# Patient Record
Sex: Female | Born: 1995 | Race: Black or African American | Hispanic: No | Marital: Single | Smoking: Never smoker
Health system: Southern US, Community
[De-identification: ages and names within clinical notes are randomized; demographics above are authoritative.]

## PROBLEM LIST (undated history)

## (undated) ENCOUNTER — Inpatient Hospital Stay (HOSPITAL_COMMUNITY): Payer: Self-pay

## (undated) DIAGNOSIS — IMO0001 Reserved for inherently not codable concepts without codable children: Secondary | ICD-10-CM

## (undated) DIAGNOSIS — O039 Complete or unspecified spontaneous abortion without complication: Secondary | ICD-10-CM

## (undated) DIAGNOSIS — B009 Herpesviral infection, unspecified: Secondary | ICD-10-CM

## (undated) DIAGNOSIS — Z789 Other specified health status: Secondary | ICD-10-CM

## (undated) HISTORY — PX: NO PAST SURGERIES: SHX2092

---
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
## 2014-02-22 ENCOUNTER — Encounter (HOSPITAL_BASED_OUTPATIENT_CLINIC_OR_DEPARTMENT_OTHER): Payer: Self-pay | Admitting: Emergency Medicine

## 2014-02-22 ENCOUNTER — Emergency Department (HOSPITAL_BASED_OUTPATIENT_CLINIC_OR_DEPARTMENT_OTHER)
Admission: EM | Admit: 2014-02-22 | Discharge: 2014-02-22 | Disposition: A | Discharge status: Home / Self Care | Payer: Medicaid Other | Attending: Emergency Medicine | Admitting: Emergency Medicine

## 2014-02-22 DIAGNOSIS — L509 Urticaria, unspecified: Secondary | ICD-10-CM | POA: Insufficient documentation

## 2014-02-22 DIAGNOSIS — R109 Unspecified abdominal pain: Secondary | ICD-10-CM | POA: Diagnosis not present

## 2014-02-22 DIAGNOSIS — Y9289 Other specified places as the place of occurrence of the external cause: Secondary | ICD-10-CM | POA: Insufficient documentation

## 2014-02-22 DIAGNOSIS — X58XXXA Exposure to other specified factors, initial encounter: Secondary | ICD-10-CM | POA: Diagnosis not present

## 2014-02-22 DIAGNOSIS — Z79899 Other long term (current) drug therapy: Secondary | ICD-10-CM | POA: Diagnosis not present

## 2014-02-22 DIAGNOSIS — Y9389 Activity, other specified: Secondary | ICD-10-CM | POA: Diagnosis not present

## 2014-02-22 DIAGNOSIS — T781XXA Other adverse food reactions, not elsewhere classified, initial encounter: Secondary | ICD-10-CM | POA: Diagnosis present

## 2014-02-22 DIAGNOSIS — Z792 Long term (current) use of antibiotics: Secondary | ICD-10-CM | POA: Diagnosis not present

## 2014-02-22 DIAGNOSIS — Y998 Other external cause status: Secondary | ICD-10-CM | POA: Insufficient documentation

## 2014-02-22 MED ORDER — PREDNISONE 20 MG PO TABS
40.0000 mg | ORAL_TABLET | Freq: Once | ORAL | Status: AC
  Administered 2014-02-22: 40 mg via ORAL
  Filled 2014-02-22: qty 2

## 2014-02-22 MED ORDER — EPINEPHRINE 0.3 MG/0.3ML IJ SOAJ: 0.3000 mg | Freq: Once | INTRAMUSCULAR | Status: DC

## 2014-02-22 MED ORDER — DIPHENHYDRAMINE HCL 25 MG PO CAPS
25.0000 mg | ORAL_CAPSULE | Freq: Once | ORAL | Status: AC
  Administered 2014-02-22: 25 mg via ORAL
  Filled 2014-02-22: qty 1

## 2014-02-22 MED ORDER — DIPHENHYDRAMINE HCL 25 MG PO TABS
25.0000 mg | ORAL_TABLET | Freq: Four times a day (QID) | ORAL | Status: DC
Start: 2014-02-22 — End: 2014-11-24

## 2014-02-22 MED ORDER — FAMOTIDINE 20 MG PO TABS: 20.0000 mg | ORAL_TABLET | Freq: Two times a day (BID) | ORAL | Status: DC

## 2014-02-22 MED ORDER — FAMOTIDINE 20 MG PO TABS
20.0000 mg | ORAL_TABLET | Freq: Once | ORAL | Status: AC
Start: 2014-02-22 — End: 2014-02-22
  Administered 2014-02-22: 20 mg via ORAL
  Filled 2014-02-22: qty 1

## 2014-02-22 MED ORDER — PREDNISONE 10 MG PO TABS
40.0000 mg | ORAL_TABLET | Freq: Every day | ORAL | Status: DC
Start: 2014-02-22 — End: 2014-11-24

## 2014-02-22 NOTE — ED Notes (Signed)
Pt reports she ate crab legs 1 hr ago and has begun to itch and having abd pain

## 2014-02-22 NOTE — ED Notes (Signed)
Dr Madilyn Hookees okay with discharging pt now prior to reassessing meds given.

## 2014-02-22 NOTE — ED Provider Notes (Signed)
CSN: [id number]     Arrival date & time 02/22/14  1954 History   First MD Initiated [id number] with Patient 02/22/14 2009     Chief Complaint  Patient presents with  . Allergic Reaction      Patient is a 18 y.o. female presenting with allergic reaction. The history is provided by the patient.  Allergic Reaction  Gabrielle Lopez presents for a possible allergic reaction. Her symptoms started about an hour ago. She was eating crab legs when she developed abdominal pain. The abdominal pain has since resolved. Shortly thereafter she developed itching and hives on her face. Eyes similar previous symptoms. She denies any tongue swelling or throat swelling. She has no itching in her mouth or throat. She has no shortness of breath or cough. No vomiting. Symptoms are mild, constant, and improving.  History reviewed. No pertinent past medical history. History reviewed. No pertinent past surgical history. History reviewed. No pertinent family history. History  Substance Use Topics  . Smoking status: Never Smoker   . Smokeless tobacco: Not on file  . Alcohol Use: No   OB History    No data available     Review of Systems  All other systems reviewed and are negative.     Allergies  Review of patient's allergies indicates no known allergies.  Home Medications   Prior to Admission medications   Medication Sig Start Date End Date Taking? Authorizing Provider  methocarbamol (ROBAXIN) 500 MG tablet Take 1 tablet (500 mg total) by mouth 2 (two) times daily. 06/01/12   Garlon HatchetLisa M Sanders, PA-C  metroNIDAZOLE (FLAGYL) 500 MG tablet Take 2 tablets (1,000 mg total) by mouth 2 (two) times daily. 06/01/12   Garlon HatchetLisa M Sanders, PA-C  metroNIDAZOLE (FLAGYL) 500 MG tablet Take 1 tablet (500 mg total) by mouth 2 (two) times daily. 02/22/13   Hope Orlene OchM Neese, NP  oxyCODONE-acetaminophen (PERCOCET/ROXICET) 5-325 MG per tablet Take 1 tablet by mouth every 4 (four) hours as needed for pain. 06/01/12   Garlon HatchetLisa M Sanders, PA-C    BP 125/76 mmHg  Pulse 77  Temp(Src) 98.2 F (36.8 C) (Oral)  Resp 18  Ht 5\' 6"  (1.676 m)  Wt 138 lb (62.596 kg)  BMI 22.28 kg/m2  SpO2 100%  LMP 02/08/2014 Physical Exam  Constitutional: She is oriented to person, place, and time. She appears well-developed and well-nourished.  HENT:  Head: Normocephalic and atraumatic.  Mouth/Throat: Oropharynx is clear and moist.  Eyes: Pupils are equal, round, and reactive to light.  Cardiovascular: Normal rate and regular rhythm.   No murmur heard. Pulmonary/Chest: Effort normal and breath sounds normal. No respiratory distress.  Abdominal: Soft. There is no tenderness. There is no rebound and no guarding.  Musculoskeletal: She exhibits no edema or tenderness.  Neurological: She is alert and oriented to person, place, and time.  Skin: Skin is warm and dry.  Scattered urticaria on face  Psychiatric: She has a normal mood and affect. Her behavior is normal.  Nursing note and vitals reviewed.   ED Course  Procedures (including critical care time) Labs Review Labs Reviewed - No data to display  Imaging Review No results found.   EKG Interpretation None      MDM   Final diagnoses:  Allergic reaction to food    Patient here for new onset food allergy. No evidence of anaphylaxis based on history and exam. Treating for allergy with steroids, Pepcid, Benadryl. Discussed with patient home care for allergic reaction as well as return  precautions. Providing prescriptions for prednisone, Pepcid, Benadryl. Also prescribed fighting prescription for EpiPen. Discussed with patient risk of recurrent worsening reaction and use of EpiPen if she does develop these symptoms.    Tilden FossaElizabeth Kaavya Puskarich, MD 02/22/14 2035

## 2014-02-22 NOTE — Discharge Instructions (Signed)
Food Allergy °A food allergy occurs from eating something you are sensitive to. Food allergies occur in all age groups. It may be passed to you from your parents (heredity).  °CAUSES  °Some common causes are cow's milk, seafood, eggs, nuts (including peanut butter), wheat, and soybeans. °SYMPTOMS  °Common problems are:  °· Swelling around the mouth. °· An itchy, red rash. °· Hives. °· Vomiting. °· Diarrhea. °Severe allergic reactions are life-threatening. This reaction is called anaphylaxis. It can cause the mouth and throat to swell. This makes it hard to breathe and swallow. In severe reactions, only a small amount of food may be fatal within seconds. °HOME CARE INSTRUCTIONS  °· If you are unsure what caused the reaction, keep a diary of foods eaten and symptoms that followed. Avoid foods that cause reactions. °· If hives or rash are present: °¨ Take medicines as directed. °¨ Use an over-the-counter antihistamine (diphenhydramine) to treat hives and itching as needed. °¨ Apply cold compresses to the skin or take baths in cool water. Avoid hot baths or showers. These will increase the redness and itching. °· If you are severely allergic: °¨ Hospitalization is often required following a severe reaction. °¨ Wear a medical alert bracelet or necklace that describes the allergy. °¨ Carry your anaphylaxis kit or epinephrine injection with you at all times. Both you and your family members should know how to use this. This can be lifesaving if you have a severe reaction. If epinephrine is used, it is important for you to seek immediate medical care or call your local emergency services (911 in [place] When the epinephrine wears off, it can be followed by a delayed reaction, which can be fatal. °· Replace your epinephrine immediately after use in case of another reaction. °· Ask your caregiver for instructions if you have not been taught how to use an epinephrine injection. °· Do not drive until medicines used to treat the  reaction have worn off, unless approved by your caregiver. °SEEK MEDICAL CARE IF:  °· You suspect a food allergy. Symptoms generally happen within 30 minutes of eating a food. °· Your symptoms have not gone away within 2 days. See your caregiver sooner if symptoms are getting worse. °· You develop new symptoms. °· You want to retest yourself with a food or drink you think causes an allergic reaction. Never do this if an anaphylactic reaction to that food or drink has happened before. °· There is a return of the symptoms which brought you to your caregiver. °SEEK IMMEDIATE MEDICAL CARE IF:  °· You have trouble breathing, are wheezing, or you have a tight feeling in your chest or throat. °· You have a swollen mouth, or you have hives, swelling, or itching all over your body. Use your epinephrine injection immediately. This is given into the outside of your thigh, deep into the muscle. Following use of the epinephrine injection, seek help right away. °Seek immediate medical care or call your local emergency services (911 in [place] °MAKE SURE YOU:  °· Understand these instructions. °· Will watch your condition. °· Will get help right away if you are not doing well or get worse. °Document Released: 02/25/2000 Document Revised: 05/22/2011 Document Reviewed: 10/17/2007 °[employer]® Patient Information ©2015 [employer]. This information is not intended to replace advice given to you by your health care provider. Make sure you discuss any questions you have with your health care provider. ° °

## 2014-04-24 ENCOUNTER — Emergency Department (HOSPITAL_BASED_OUTPATIENT_CLINIC_OR_DEPARTMENT_OTHER)
Admission: EM | Admit: 2014-04-24 | Discharge: 2014-04-25 | Disposition: A | Discharge status: Home / Self Care | Payer: Medicaid Other | Attending: Emergency Medicine | Admitting: Emergency Medicine

## 2014-04-24 DIAGNOSIS — Z79899 Other long term (current) drug therapy: Secondary | ICD-10-CM | POA: Diagnosis not present

## 2014-04-24 DIAGNOSIS — O2340 Unspecified infection of urinary tract in pregnancy, unspecified trimester: Secondary | ICD-10-CM | POA: Diagnosis present

## 2014-04-24 DIAGNOSIS — N390 Urinary tract infection, site not specified: Secondary | ICD-10-CM

## 2014-04-24 DIAGNOSIS — Z3A00 Weeks of gestation of pregnancy not specified: Secondary | ICD-10-CM | POA: Insufficient documentation

## 2014-04-24 DIAGNOSIS — Z7952 Long term (current) use of systemic steroids: Secondary | ICD-10-CM | POA: Diagnosis not present

## 2014-04-24 DIAGNOSIS — Z3490 Encounter for supervision of normal pregnancy, unspecified, unspecified trimester: Secondary | ICD-10-CM

## 2014-04-25 ENCOUNTER — Encounter (HOSPITAL_BASED_OUTPATIENT_CLINIC_OR_DEPARTMENT_OTHER): Payer: Self-pay | Admitting: *Deleted

## 2014-04-25 LAB — URINE MICROSCOPIC-ADD ON

## 2014-04-25 LAB — URINALYSIS, ROUTINE W REFLEX MICROSCOPIC
Bilirubin Urine: NEGATIVE
Glucose, UA: NEGATIVE mg/dL
Ketones, ur: NEGATIVE mg/dL
Nitrite: POSITIVE — AB
PROTEIN: 100 mg/dL — AB
Specific Gravity, Urine: 1.031 — ABNORMAL HIGH (ref 1.005–1.030)
Urobilinogen, UA: 1 mg/dL (ref 0.0–1.0)
pH: 6.5 (ref 5.0–8.0)

## 2014-04-25 LAB — PREGNANCY, URINE: Preg Test, Ur: POSITIVE — AB

## 2014-04-25 MED ORDER — NITROFURANTOIN MONOHYD MACRO 100 MG PO CAPS
100.0000 mg | ORAL_CAPSULE | Freq: Once | ORAL | Status: AC
  Administered 2014-04-25: 100 mg via ORAL
  Filled 2014-04-25: qty 1

## 2014-04-25 MED ORDER — PRENATAL COMPLETE 14-0.4 MG PO TABS: 1.0000 | ORAL_TABLET | Freq: Every morning | ORAL | Status: DC

## 2014-04-25 MED ORDER — NITROFURANTOIN MONOHYD MACRO 100 MG PO CAPS: 100.0000 mg | ORAL_CAPSULE | Freq: Two times a day (BID) | ORAL | Status: DC

## 2014-04-25 NOTE — ED Provider Notes (Signed)
CSN: [id number]     Arrival date & time 04/24/14  2347 History   First MD Initiated Contact with Patient 04/25/14 0003     Chief Complaint  Patient presents with  . Urinary Tract Infection     (Consider location/radiation/quality/duration/timing/severity/associated sxs/prior Treatment) Patient is a [age] y.o. female presenting with urinary tract infection. The history is provided by the patient.  Urinary Tract Infection This is a recurrent problem. The current episode started 12 to 24 hours ago. The problem occurs constantly. The problem has not changed since onset.Pertinent negatives include no chest pain, no abdominal pain, no headaches and no shortness of breath. Nothing aggravates the symptoms. Nothing relieves the symptoms. She has tried nothing for the symptoms. The treatment provided no relief.  Has had dysuria with each urination x 1 day.  No f/c/r.  No abdominal pain.  No nausea vomiting or diarrhea.  No vaginal bleeding or discharge.  States her periods are irregular.    History reviewed. No pertinent past medical history. History reviewed. No pertinent past surgical history. History reviewed. No pertinent family history. History  Substance Use Topics  . Smoking status: Never Smoker   . Smokeless tobacco: Not on file  . Alcohol Use: No   OB History    No data available     Review of Systems  Constitutional: Negative for fever.  Respiratory: Negative for shortness of breath.   Cardiovascular: Negative for chest pain.  Gastrointestinal: Negative for nausea, vomiting, abdominal pain and diarrhea.  Genitourinary: Positive for dysuria. Negative for frequency, hematuria, flank pain, vaginal bleeding, vaginal discharge, genital sores and pelvic pain.  Neurological: Negative for headaches.  All other systems reviewed and are negative.     Allergies  Shellfish allergy  Home Medications   Prior to Admission medications   Medication Sig Start Date End Date Taking?  Authorizing Provider  diphenhydrAMINE (BENADRYL) 25 MG tablet Take 1 tablet (25 mg total) by mouth every 6 (six) hours. 02/22/14   Tilden FossaElizabeth Rees, MD  EPINEPHrine 0.3 mg/0.3 mL IJ SOAJ injection Inject 0.3 mLs (0.3 mg total) into the muscle once. 02/22/14   Tilden FossaElizabeth Rees, MD  famotidine (PEPCID) 20 MG tablet Take 1 tablet (20 mg total) by mouth 2 (two) times daily. 02/22/14   Tilden FossaElizabeth Rees, MD  methocarbamol (ROBAXIN) 500 MG tablet Take 1 tablet (500 mg total) by mouth 2 (two) times daily. 06/01/12   Garlon HatchetLisa M Sanders, PA-C  metroNIDAZOLE (FLAGYL) 500 MG tablet Take 2 tablets (1,000 mg total) by mouth 2 (two) times daily. 06/01/12   Garlon HatchetLisa M Sanders, PA-C  metroNIDAZOLE (FLAGYL) 500 MG tablet Take 1 tablet (500 mg total) by mouth 2 (two) times daily. 02/22/13   Hope Orlene OchM Neese, NP  nitrofurantoin, macrocrystal-monohydrate, (MACROBID) 100 MG capsule Take 1 capsule (100 mg total) by mouth 2 (two) times daily. X 7 days 04/25/14   Rahkim Rabalais K Shanoah Asbill-Rasch, MD  oxyCODONE-acetaminophen (PERCOCET/ROXICET) 5-325 MG per tablet Take 1 tablet by mouth every 4 (four) hours as needed for pain. 06/01/12   Garlon HatchetLisa M Sanders, PA-C  predniSONE (DELTASONE) 10 MG tablet Take 4 tablets (40 mg total) by mouth daily. Start taking on 02/23/14 02/22/14   Tilden FossaElizabeth Rees, MD  Prenatal Vit-Fe Fumarate-FA (PRENATAL COMPLETE) 14-0.4 MG TABS Take 1 tablet by mouth every morning. 04/25/14   Dim Meisinger K Elvyn Krohn-Rasch, MD   BP 133/70 mmHg  Pulse 104  Temp(Src) 98.1 F (36.7 C) (Oral)  Resp 20  Ht 5\' 7"  (1.702 m)  Wt 136 lb (61.689 kg)  BMI 21.30 kg/m2  SpO2 100% Physical Exam  Constitutional: She is oriented to person, place, and time. She appears well-developed and well-nourished. No distress.  HENT:  Head: Normocephalic and atraumatic.  Mouth/Throat: Oropharynx is clear and moist.  Eyes: Conjunctivae are normal. Pupils are equal, round, and reactive to light.  Neck: Normal range of motion. Neck supple.  Cardiovascular: Normal rate, regular  rhythm and intact distal pulses.   Pulmonary/Chest: Effort normal and breath sounds normal. No respiratory distress. She has no wheezes. She has no rales.  Abdominal: Soft. Bowel sounds are normal. There is no tenderness. There is no rebound and no guarding.  Musculoskeletal: Normal range of motion.  Neurological: She is alert and oriented to person, place, and time.  Skin: Skin is warm and dry.  Psychiatric: She has a normal mood and affect.    ED Course  Procedures (including critical care time) Labs Review Labs Reviewed  URINALYSIS, ROUTINE W REFLEX MICROSCOPIC - Abnormal; Notable for the following:    APPearance CLOUDY (*)    Specific Gravity, Urine 1.031 (*)    Hgb urine dipstick LARGE (*)    Protein, ur 100 (*)    Nitrite POSITIVE (*)    Leukocytes, UA MODERATE (*)    All other components within normal limits  PREGNANCY, URINE - Abnormal; Notable for the following:    Preg Test, Ur POSITIVE (*)    All other components within normal limits  URINE MICROSCOPIC-ADD ON - Abnormal; Notable for the following:    Squamous Epithelial / LPF FEW (*)    Bacteria, UA MANY (*)    All other components within normal limits    Imaging Review No results found.   EKG Interpretation None      MDM   Final diagnoses:  UTI (lower urinary tract infection)  Pregnancy    UTI and pregnancy.  No pain discharge or bleeding.  Will prescribe macrobid as is safe in pregnancy.   Patient reports she has a GYN and will follow up with them.  Strict return precautions given.      Jasmine Awe, MD 04/25/14 [phone]

## 2014-04-25 NOTE — ED Notes (Signed)
Pt reports dysuria, urinary frequency, urgency x 1 day.  Denies back pain.

## 2014-04-25 NOTE — ED Notes (Signed)
Burning w urination x 1 day

## 2014-04-25 NOTE — ED Notes (Signed)
Patient gave urine sample. 

## 2014-11-01 ENCOUNTER — Emergency Department (HOSPITAL_BASED_OUTPATIENT_CLINIC_OR_DEPARTMENT_OTHER)
Admission: EM | Admit: 2014-11-01 | Discharge: 2014-11-01 | Disposition: A | Discharge status: Home / Self Care | Payer: Medicaid Other | Attending: Emergency Medicine | Admitting: Emergency Medicine

## 2014-11-01 ENCOUNTER — Encounter (HOSPITAL_BASED_OUTPATIENT_CLINIC_OR_DEPARTMENT_OTHER): Payer: Self-pay | Admitting: *Deleted

## 2014-11-01 DIAGNOSIS — L259 Unspecified contact dermatitis, unspecified cause: Secondary | ICD-10-CM

## 2014-11-01 DIAGNOSIS — Z7952 Long term (current) use of systemic steroids: Secondary | ICD-10-CM | POA: Diagnosis not present

## 2014-11-01 DIAGNOSIS — Z8619 Personal history of other infectious and parasitic diseases: Secondary | ICD-10-CM | POA: Diagnosis not present

## 2014-11-01 DIAGNOSIS — Z3202 Encounter for pregnancy test, result negative: Secondary | ICD-10-CM | POA: Insufficient documentation

## 2014-11-01 DIAGNOSIS — Z79899 Other long term (current) drug therapy: Secondary | ICD-10-CM | POA: Diagnosis not present

## 2014-11-01 DIAGNOSIS — L988 Other specified disorders of the skin and subcutaneous tissue: Secondary | ICD-10-CM | POA: Insufficient documentation

## 2014-11-01 HISTORY — DX: Herpesviral infection, unspecified: B00.9

## 2014-11-01 LAB — URINALYSIS, ROUTINE W REFLEX MICROSCOPIC
Bilirubin Urine: NEGATIVE
Glucose, UA: NEGATIVE mg/dL
Hgb urine dipstick: NEGATIVE
Ketones, ur: NEGATIVE mg/dL
Nitrite: NEGATIVE
Protein, ur: NEGATIVE mg/dL
Specific Gravity, Urine: 1.025 (ref 1.005–1.030)
Urobilinogen, UA: 0.2 mg/dL (ref 0.0–1.0)
pH: 6 (ref 5.0–8.0)

## 2014-11-01 LAB — URINE MICROSCOPIC-ADD ON

## 2014-11-01 LAB — PREGNANCY, URINE: Preg Test, Ur: NEGATIVE

## 2014-11-01 MED ORDER — ACYCLOVIR 400 MG PO TABS: 400.0000 mg | ORAL_TABLET | Freq: Every day | ORAL | Status: DC

## 2014-11-01 NOTE — ED Notes (Signed)
Hx of herpes, states she thinks she is having an outbreak, having soreness in vaginal area, noted having one lesion at area as well, no recent intercourse

## 2014-11-01 NOTE — Discharge Instructions (Signed)
Return to the ED with any concerns including increased pain, fever, abdominal pain, or any other alarming symptoms

## 2014-11-01 NOTE — ED Notes (Signed)
Here for evaluation of possible herpes outbreak

## 2014-11-01 NOTE — ED Provider Notes (Signed)
CSN: [id number]     Arrival date & time 11/01/14  1042 History   First MD Initiated Contact with Patient 11/01/14 1056     Chief Complaint  Patient presents with  . outbreak of herpes      (Consider location/radiation/quality/duration/timing/severity/associated sxs/prior Treatment) HPI  Pt presents with c/o rash in perineal region.  She shaved 2 days ago and since then has noted bumps on her skin.  Bumps are somewhat ithcy.  No significant pain.  No vaginal discharge, no abdominal pain.  Has hx of genital herpes and is wondering if this is an outbreak.  No fever/chills.  No other systemic symptoms.  Symptoms are continous.  There are no other associated systemic symptoms, there are no other alleviating or modifying factors.   Past Medical History  Diagnosis Date  . Herpes    History reviewed. No pertinent past surgical history. No family history on file. Social History  Substance Use Topics  . Smoking status: Never Smoker   . Smokeless tobacco: None  . Alcohol Use: Yes     Comment: sociably   OB History    No data available     Review of Systems  ROS reviewed and all otherwise negative except for mentioned in HPI    Allergies  Shellfish allergy  Home Medications   Prior to Admission medications   Medication Sig Start Date End Date Taking? Authorizing Provider  acyclovir (ZOVIRAX) 400 MG tablet Take 1 tablet (400 mg total) by mouth 5 (five) times daily. 11/01/14   Jerelyn Scott, MD  diphenhydrAMINE (BENADRYL) 25 MG tablet Take 1 tablet (25 mg total) by mouth every 6 (six) hours. 02/22/14   Tilden Fossa, MD  EPINEPHrine 0.3 mg/0.3 mL IJ SOAJ injection Inject 0.3 mLs (0.3 mg total) into the muscle once. 02/22/14   Tilden Fossa, MD  famotidine (PEPCID) 20 MG tablet Take 1 tablet (20 mg total) by mouth 2 (two) times daily. 02/22/14   Tilden Fossa, MD  methocarbamol (ROBAXIN) 500 MG tablet Take 1 tablet (500 mg total) by mouth 2 (two) times daily. 06/01/12   Garlon Hatchet, PA-C  metroNIDAZOLE (FLAGYL) 500 MG tablet Take 2 tablets (1,000 mg total) by mouth 2 (two) times daily. 06/01/12   Garlon Hatchet, PA-C  metroNIDAZOLE (FLAGYL) 500 MG tablet Take 1 tablet (500 mg total) by mouth 2 (two) times daily. 02/22/13   Hope Orlene Och, NP  nitrofurantoin, macrocrystal-monohydrate, (MACROBID) 100 MG capsule Take 1 capsule (100 mg total) by mouth 2 (two) times daily. X 7 days 04/25/14   April Palumbo, MD  oxyCODONE-acetaminophen (PERCOCET/ROXICET) 5-325 MG per tablet Take 1 tablet by mouth every 4 (four) hours as needed for pain. 06/01/12   Garlon Hatchet, PA-C  predniSONE (DELTASONE) 10 MG tablet Take 4 tablets (40 mg total) by mouth daily. Start taking on 02/23/14 02/22/14   Tilden Fossa, MD  Prenatal Vit-Fe Fumarate-FA (PRENATAL COMPLETE) 14-0.4 MG TABS Take 1 tablet by mouth every morning. 04/25/14   April Palumbo, MD   BP 121/59 mmHg  Pulse 88  Temp(Src) 98.7 F (37.1 C) (Oral)  Resp 16  Ht  (1.676 m)  Wt 135 lb (61.236 kg)  BMI 21.80 kg/m2  SpO2 100%  LMP 10/03/2014 (Exact Date)  Vitals reviewed Physical Exam  Physical Examination: General appearance - alert, well appearing, and in no distress Mental status - alert, oriented to person, place, and time Eyes - no conjunctival injection, no scleral icterus Pelvic - normal external genitalia, vulva-  appears to have follicular irritation, no vesicles c/w herpes, no induration or fluctuance Neurological - alert, oriented, normal speech,  Extremities - peripheral pulses normal, no pedal edema, no clubbing or cyanosis Skin - normal coloration and turgor, no rashes  ED Course  Procedures (including critical care time) Labs Review Labs Reviewed  URINALYSIS, ROUTINE W REFLEX MICROSCOPIC (NOT AT [facility]) - Abnormal; Notable for the following:    APPearance CLOUDY (*)    Leukocytes, UA SMALL (*)    All other components within normal limits  URINE MICROSCOPIC-ADD ON - Abnormal; Notable for the following:     Squamous Epithelial / LPF MANY (*)    Bacteria, UA MANY (*)    All other components within normal limits  URINE CULTURE  PREGNANCY, URINE    Imaging Review No results found. I have personally reviewed and evaluated these images and lab results as part of my medical decision-making.   EKG Interpretation None      MDM   Final diagnoses:  Skin irritation from shaving    Pt presenting with irritation of perineal skin due to shaving, no vesicles to suggest herpes.  Pt given rx for acyclovir in case she gets an outbreak of herpes in the next few days, otherwise she will not need to take.  Discharged with strict return precautions.  Pt agreeable with plan.    Jerelyn Scott, MD 11/01/14 [phone]

## 2014-11-03 LAB — URINE CULTURE

## 2014-11-24 ENCOUNTER — Emergency Department (HOSPITAL_BASED_OUTPATIENT_CLINIC_OR_DEPARTMENT_OTHER): Payer: Medicaid Other

## 2014-11-24 ENCOUNTER — Encounter (HOSPITAL_BASED_OUTPATIENT_CLINIC_OR_DEPARTMENT_OTHER): Payer: Self-pay | Admitting: *Deleted

## 2014-11-24 ENCOUNTER — Emergency Department (HOSPITAL_BASED_OUTPATIENT_CLINIC_OR_DEPARTMENT_OTHER)
Admission: EM | Admit: 2014-11-24 | Discharge: 2014-11-24 | Disposition: A | Discharge status: Home / Self Care | Payer: Medicaid Other | Attending: Emergency Medicine | Admitting: Emergency Medicine

## 2014-11-24 DIAGNOSIS — O2391 Unspecified genitourinary tract infection in pregnancy, first trimester: Secondary | ICD-10-CM | POA: Diagnosis not present

## 2014-11-24 DIAGNOSIS — Z8619 Personal history of other infectious and parasitic diseases: Secondary | ICD-10-CM | POA: Insufficient documentation

## 2014-11-24 DIAGNOSIS — R51 Headache: Secondary | ICD-10-CM | POA: Insufficient documentation

## 2014-11-24 DIAGNOSIS — B9689 Other specified bacterial agents as the cause of diseases classified elsewhere: Secondary | ICD-10-CM

## 2014-11-24 DIAGNOSIS — Z3490 Encounter for supervision of normal pregnancy, unspecified, unspecified trimester: Secondary | ICD-10-CM

## 2014-11-24 DIAGNOSIS — O9989 Other specified diseases and conditions complicating pregnancy, childbirth and the puerperium: Secondary | ICD-10-CM | POA: Diagnosis present

## 2014-11-24 DIAGNOSIS — Z79899 Other long term (current) drug therapy: Secondary | ICD-10-CM | POA: Diagnosis not present

## 2014-11-24 DIAGNOSIS — Z3A01 Less than 8 weeks gestation of pregnancy: Secondary | ICD-10-CM | POA: Diagnosis not present

## 2014-11-24 DIAGNOSIS — R42 Dizziness and giddiness: Secondary | ICD-10-CM | POA: Diagnosis not present

## 2014-11-24 DIAGNOSIS — N760 Acute vaginitis: Secondary | ICD-10-CM

## 2014-11-24 DIAGNOSIS — Z7952 Long term (current) use of systemic steroids: Secondary | ICD-10-CM | POA: Diagnosis not present

## 2014-11-24 DIAGNOSIS — R1032 Left lower quadrant pain: Secondary | ICD-10-CM

## 2014-11-24 LAB — URINALYSIS, ROUTINE W REFLEX MICROSCOPIC
Bilirubin Urine: NEGATIVE
GLUCOSE, UA: NEGATIVE mg/dL
HGB URINE DIPSTICK: NEGATIVE
Ketones, ur: NEGATIVE mg/dL
Nitrite: NEGATIVE
Protein, ur: NEGATIVE mg/dL
SPECIFIC GRAVITY, URINE: 1.03 (ref 1.005–1.030)
Urobilinogen, UA: 0.2 mg/dL (ref 0.0–1.0)
pH: 6 (ref 5.0–8.0)

## 2014-11-24 LAB — BASIC METABOLIC PANEL
Anion gap: 8 (ref 5–15)
BUN: 9 mg/dL (ref 6–20)
CALCIUM: 9.3 mg/dL (ref 8.9–10.3)
CO2: 26 mmol/L (ref 22–32)
Chloride: 101 mmol/L (ref 101–111)
Creatinine, Ser: 0.61 mg/dL (ref 0.44–1.00)
GFR calc Af Amer: >60 mL/min (ref >60)
GLUCOSE: 91 mg/dL (ref 65–99)
Potassium: 3.8 mmol/L (ref 3.5–5.1)
SODIUM: 135 mmol/L (ref 135–145)

## 2014-11-24 LAB — PREGNANCY, URINE: Preg Test, Ur: POSITIVE — AB

## 2014-11-24 LAB — URINE MICROSCOPIC-ADD ON

## 2014-11-24 LAB — HCG, QUANTITATIVE, PREGNANCY: hCG, Beta Chain, Quant, S: 50230 m[IU]/mL — ABNORMAL HIGH (ref <5)

## 2014-11-24 LAB — CBC
HCT: 33.7 % — ABNORMAL LOW (ref 36.0–46.0)
Hemoglobin: 11.3 g/dL — ABNORMAL LOW (ref 12.0–15.0)
MCH: 27.5 pg (ref 26.0–34.0)
MCHC: 33.5 g/dL (ref 30.0–36.0)
MCV: 82 fL (ref 78.0–100.0)
PLATELETS: 299 10*3/uL (ref 150–400)
RBC: 4.11 MIL/uL (ref 3.87–5.11)
RDW: 14.9 % (ref 11.5–15.5)
WBC: 7.9 10*3/uL (ref 4.0–10.5)

## 2014-11-24 LAB — WET PREP, GENITAL
Trich, Wet Prep: NONE SEEN
Yeast Wet Prep HPF POC: NONE SEEN

## 2014-11-24 MED ORDER — PRENATAL COMPLETE 14-0.4 MG PO TABS: 1.0000 | ORAL_TABLET | Freq: Every morning | ORAL | Status: AC

## 2014-11-24 MED ORDER — METRONIDAZOLE 500 MG PO TABS: 500.0000 mg | ORAL_TABLET | Freq: Two times a day (BID) | ORAL | Status: DC

## 2014-11-24 MED ORDER — PRENATAL COMPLETE 14-0.4 MG PO TABS: 1.0000 | ORAL_TABLET | Freq: Every morning | ORAL | Status: DC

## 2014-11-24 NOTE — Discharge Instructions (Signed)
Please read and follow all provided instructions.  Your diagnoses today include:  1. Left lower quadrant pain   2. LLQ pain   3. Pregnancy   4. Bacterial vaginosis    Tests performed today include:  Blood counts and electrolytes  Urine test to look for infection and pregnancy (in women)  Ultrasound - shows pregnancy in the uterus, right ovarian cyst  Vital signs. See below for your results today.   Medications prescribed:   Metronidazole - antibiotic  You have been prescribed an antibiotic medicine: take the entire course of medicine even if you are feeling better. Stopping early can cause the antibiotic not to work. Do not drink alcohol when taking this medication.   Take any prescribed medications only as directed.  Home care instructions:   Follow any educational materials contained in this packet.  Follow-up instructions: Please follow-up with your primary care provider in the next 3 days for further evaluation of your symptoms.    Return instructions:  SEEK IMMEDIATE MEDICAL ATTENTION IF:  The pain does not go away or becomes severe   A temperature above 101F develops   Repeated vomiting occurs (multiple episodes)   The pain becomes localized to portions of the abdomen. The right side could possibly be appendicitis. In an adult, the left lower portion of the abdomen could be colitis or diverticulitis.   Blood is being passed in stools or vomit (bright red or black tarry stools)   You develop chest pain, difficulty breathing, dizziness or fainting, or become confused, poorly responsive, or inconsolable (young children)  If you have any other emergent concerns regarding your health  Additional Information: Abdominal (belly) pain can be caused by many things. Your caregiver performed an examination and possibly ordered blood/urine tests and imaging (CT scan, x-rays, ultrasound). Many cases can be observed and treated at home after initial evaluation in the  emergency department. Even though you are being discharged home, abdominal pain can be unpredictable. Therefore, you need a repeated exam if your pain does not resolve, returns, or worsens. Most patients with abdominal pain don't have to be admitted to the hospital or have surgery, but serious problems like appendicitis and gallbladder attacks can start out as nonspecific pain. Many abdominal conditions cannot be diagnosed in one visit, so follow-up evaluations are very important.  Your vital signs today were: BP 120/66 mmHg   Pulse 82   Temp(Src) 98.2 F (36.8 C) (Oral)   Resp 16   Ht  (1.676 m)   Wt 135 lb (61.236 kg)   BMI 21.80 kg/m2   SpO2 100%   LMP 10/09/2014 If your blood pressure (bp) was elevated above 135/85 this visit, please have this repeated by your doctor within one month. --------------

## 2014-11-24 NOTE — ED Notes (Signed)
Abdominal pain x 3 days. Headache. Thinks she has BV.

## 2014-11-24 NOTE — ED Notes (Signed)
EDP at bedside  

## 2014-11-24 NOTE — ED Notes (Signed)
Pt ambulated to US

## 2014-11-24 NOTE — ED Provider Notes (Signed)
CSN: [id number]     Arrival date & time 11/24/14  1347 History   First MD Initiated Contact with Patient 11/24/14 1457     Chief Complaint  Patient presents with  . Abdominal Pain     (Consider location/radiation/quality/duration/timing/severity/associated sxs/prior Treatment) HPI Comments: Patient presents with complaint of intermittent left lower abdominal pain, lightheadedness, headache for the past 3 days. Last menstrual period was at the end of July. Patient noted that she had some spotting yesterday but not today. She denies vaginal discharge or urinary symptoms. No nausea, vomiting, or diarrhea. No significant chest pain or shortness of breath. She has been pregnant before and had early labor but no other complications. The onset of this condition was acute. The course is intermitent. Aggravating factors: none. Alleviating factors: none. No treatments PTA.    The history is provided by the patient.    Past Medical History  Diagnosis Date  . Herpes    History reviewed. No pertinent past surgical history. No family history on file. Social History  Substance Use Topics  . Smoking status: Never Smoker   . Smokeless tobacco: None  . Alcohol Use: Yes     Comment: sociably   OB History    No data available     Review of Systems  Constitutional: Negative for fever.  HENT: Negative for rhinorrhea and sore throat.   Eyes: Negative for redness.  Respiratory: Negative for cough.   Cardiovascular: Negative for chest pain.  Gastrointestinal: Positive for abdominal pain. Negative for nausea, vomiting and diarrhea.  Genitourinary: Negative for dysuria.  Musculoskeletal: Negative for myalgias.  Skin: Negative for rash.  Neurological: Positive for light-headedness and headaches. Negative for syncope.    Allergies  Shellfish allergy  Home Medications   Prior to Admission medications   Medication Sig Start Date End Date Taking? Authorizing Provider  acyclovir (ZOVIRAX) 400 MG  tablet Take 1 tablet (400 mg total) by mouth 5 (five) times daily. 11/01/14   Jerelyn Scott, MD  diphenhydrAMINE (BENADRYL) 25 MG tablet Take 1 tablet (25 mg total) by mouth every 6 (six) hours. 02/22/14   Tilden Fossa, MD  EPINEPHrine 0.3 mg/0.3 mL IJ SOAJ injection Inject 0.3 mLs (0.3 mg total) into the muscle once. 02/22/14   Tilden Fossa, MD  famotidine (PEPCID) 20 MG tablet Take 1 tablet (20 mg total) by mouth 2 (two) times daily. 02/22/14   Tilden Fossa, MD  methocarbamol (ROBAXIN) 500 MG tablet Take 1 tablet (500 mg total) by mouth 2 (two) times daily. 06/01/12   Garlon Hatchet, PA-C  metroNIDAZOLE (FLAGYL) 500 MG tablet Take 2 tablets (1,000 mg total) by mouth 2 (two) times daily. 06/01/12   Garlon Hatchet, PA-C  metroNIDAZOLE (FLAGYL) 500 MG tablet Take 1 tablet (500 mg total) by mouth 2 (two) times daily. 02/22/13   Hope Orlene Och, NP  nitrofurantoin, macrocrystal-monohydrate, (MACROBID) 100 MG capsule Take 1 capsule (100 mg total) by mouth 2 (two) times daily. X 7 days 04/25/14   April Palumbo, MD  oxyCODONE-acetaminophen (PERCOCET/ROXICET) 5-325 MG per tablet Take 1 tablet by mouth every 4 (four) hours as needed for pain. 06/01/12   Garlon Hatchet, PA-C  predniSONE (DELTASONE) 10 MG tablet Take 4 tablets (40 mg total) by mouth daily. Start taking on 02/23/14 02/22/14   Tilden Fossa, MD  Prenatal Vit-Fe Fumarate-FA (PRENATAL COMPLETE) 14-0.4 MG TABS Take 1 tablet by mouth every morning. 04/25/14   April Palumbo, MD   BP 102/60 mmHg  Pulse 93  Temp(Src)  98.2 F (36.8 C) (Oral)  Resp 20  Ht 5\' 6"  (1.676 m)  Wt 135 lb (61.236 kg)  BMI 21.80 kg/m2  SpO2 100%  LMP 11/09/2014   Physical Exam  Constitutional: She appears well-developed and well-nourished.  HENT:  Head: Normocephalic and atraumatic.  Eyes: Conjunctivae are normal. Right eye exhibits no discharge. Left eye exhibits no discharge.  Neck: Normal range of motion. Neck supple.  Cardiovascular: Normal rate, regular rhythm  and normal heart sounds.   Pulmonary/Chest: Effort normal and breath sounds normal.  Abdominal: Soft. There is tenderness (mild LLQ). There is no rebound and no guarding.  Genitourinary: There is no rash or tenderness on the right labia. There is no rash or tenderness on the left labia. Uterus is tender. Cervix exhibits no motion tenderness and no discharge. Right adnexum displays tenderness (mild). Right adnexum displays no mass. Left adnexum displays tenderness (mild-moderate). Left adnexum displays no mass. No tenderness in the vagina. Vaginal discharge (thick white, coating walls of vaginal vault) found.  Neurological: She is alert.  Skin: Skin is warm and dry.  Psychiatric: She has a normal mood and affect.  Nursing note and vitals reviewed.   ED Course  Procedures (including critical care time) Labs Review Labs Reviewed  WET PREP, GENITAL - Abnormal; Notable for the following:    Clue Cells Wet Prep HPF POC TOO NUMEROUS TO COUNT (*)    WBC, Wet Prep HPF POC MANY (*)    All other components within normal limits  URINALYSIS, ROUTINE W REFLEX MICROSCOPIC (NOT AT [facility]) - Abnormal; Notable for the following:    APPearance CLOUDY (*)    Leukocytes, UA MODERATE (*)    All other components within normal limits  PREGNANCY, URINE - Abnormal; Notable for the following:    Preg Test, Ur POSITIVE (*)    All other components within normal limits  URINE MICROSCOPIC-ADD ON - Abnormal; Notable for the following:    Squamous Epithelial / LPF FEW (*)    Bacteria, UA FEW (*)    All other components within normal limits  CBC - Abnormal; Notable for the following:    Hemoglobin 11.3 (*)    HCT 33.7 (*)    All other components within normal limits  HCG, QUANTITATIVE, PREGNANCY - Abnormal; Notable for the following:    hCG, Beta Chain, Quant, S 50230 (*)    All other components within normal limits  BASIC METABOLIC PANEL  GC/CHLAMYDIA PROBE AMP ([place]) NOT AT [facility]    Imaging Review US Ob  Comp Less 14 Wks  11/24/2014   CLINICAL DATA:  LEFT lower quadrant pain for 2 weeks. Headache for 3 days. Positive urine pregnancy test.  EXAM: OBSTETRIC <14 WK [place] AND TRANSVAGINAL OB US  TECHNIQUE: Both transabdominal and transvaginal ultrasound examinations were performed for complete evaluation of the gestation as well as the maternal uterus, adnexal regions, and pelvic cul-de-sac. Transvaginal technique was performed to assess early pregnancy.  COMPARISON:  None.  FINDINGS: Intrauterine gestational sac: Visualized/normal in shape.  Yolk sac:  Present  Embryo:  Present  Cardiac Activity: Present  Heart Rate: 86  bpm  CRL:  4  Mm   6 w   1 d                  [place] EDC: 07/19/2015.  Maternal uterus/adnexae: RIGHT intra-ovarian mixed echogenicity lesion measuring 22 mm x 16 mm x 16 mm probably of represents a corpus luteum cyst. The LEFT ovary appears normal.  No subchorionic hemorrhage. No free fluid.  IMPRESSION: UncomplicateAndreas Newportrauterine pregnancy.   Electronically Signed   By: Geoffrey  Lamke M.D.   On: 11/24/2014 17:03   US Ob Transvaginal  11/24/2014   CLINICAL DATA:  LEFT lower quadrant pain for 2 weeks. Headache for 3 days. Positive urine pregnancy test.  EXAM: OBSTETRIC <14 WK [place] AND TRANSVAGINAL OB US  TECHNIQUE: Both transabdominal and transvaginal ultrasound examinations were performed for complete evaluation of the gestation as well as the maternal uterus, adnexal regions, and pelvic cul-de-sac. Transvaginal technique was performed to assess early pregnancy.  COMPARISON:  None.  FINDINGS: Intrauterine gestational sac: Visualized/normal in shape.  Yolk sac:  Present  Embryo:  Present  Cardiac Activity: Present  Heart Rate: 86  bpm  CRL:  4  Mm   6 w   1 d                  [place] EDC: 07/19/2015.  Maternal uterus/adnexae: RIGHT intra-ovarian mixed echogenicity lesion measuring 22 mm x 16 mm x 16 mm probably of represents a corpus luteum cyst. The LEFT ovary appears normal. No subchorionic hemorrhage.  No free fluid.  IMPRESSION: Uncomplicated single intrauterine pregnancy.   Electronically Signed   By: Andreas Newport M.D.   On: 11/24/2014 17:03   I have personally reviewed and evaluated these images and lab results as part of my medical decision-making.   EKG Interpretation None       3:07 PM Patient seen and examined. Work-up initiated.   Vital signs reviewed and are as follows: BP 102/60 mmHg  Pulse 93  Temp(Src) 98.2 F (36.8 C) (Oral)  Resp 20  Ht  (1.676 m)  Wt 135 lb (61.236 kg)  BMI 21.80 kg/m2  SpO2 100%  LMP 11/09/2014  3:31 PM Pelvic exam performed with nurse chaperone Morrie Sheldon RN).   5:39 PM US performed. Patient updated on results. Will treat BV.  Encourage return to the emergency department with worsening abdominal pain, nausea, vomiting, diarrhea, fever.   MDM   Final diagnoses:  Left lower quadrant pain  Pregnancy  Bacterial vaginosis   Pregnancy/abdominal pain: Intrauterine pregnancy confirmed at 6 weeks and 1 day. Incidental right ovarian cyst noted. No concerning findings for ectopic pregnancy. No bleeding on exam.  Bacterial vaginosis: Exam is suggestive of BV. Many clue cells noted. Patient is bothered by the odor and will treat.    Renne Crigler, PA-C 11/24/14 1741  Gwyneth Sprout, MD 11/24/14 [phone]

## 2014-11-25 LAB — GC/CHLAMYDIA PROBE AMP (~~LOC~~) NOT AT ARMC
Chlamydia: POSITIVE — AB
NEISSERIA GONORRHEA: POSITIVE — AB

## 2014-11-26 ENCOUNTER — Telehealth (HOSPITAL_BASED_OUTPATIENT_CLINIC_OR_DEPARTMENT_OTHER): Payer: Self-pay | Admitting: Emergency Medicine

## 2014-11-26 NOTE — Telephone Encounter (Signed)
Chart handoff to EDP for treatment plan, + Chlamydia + GC on 11/24/14

## 2014-11-28 ENCOUNTER — Telehealth (HOSPITAL_COMMUNITY): Payer: Self-pay | Admitting: Emergency Medicine

## 2014-11-28 NOTE — Telephone Encounter (Signed)
Post ED Visit - Positive Culture Follow-up: Successful Patient Follow-Up  Positive Gonorrhea and Chlamydia culture   Patient discharged without antimicrobial prescription and treatment is now indicated  Organism is resistant to prescribed ED discharge antimicrobial  Patient with positive blood cultures  Changes discussed with ED provider: Geoffery Lyons, MD New antibiotic prescription: "Patient should return to ER for Rocephin and Zithromax"  Contacted patient, date 11/28/14, time 1130  pt returned call, ID verified x three. Patient notified of positive Gonorrhea and Chlamydia. Instructed to return to ER for Rocephin and Zithromax. Patient verbalized understanding.  [facility] 11/28/2014, 11:30 AM

## 2016-09-26 ENCOUNTER — Other Ambulatory Visit (HOSPITAL_COMMUNITY): Payer: Self-pay | Admitting: Specialist

## 2016-09-26 DIAGNOSIS — Z3A10 10 weeks gestation of pregnancy: Secondary | ICD-10-CM

## 2016-09-26 DIAGNOSIS — Z8751 Personal history of pre-term labor: Secondary | ICD-10-CM

## 2016-09-26 DIAGNOSIS — O30001 Twin pregnancy, unspecified number of placenta and unspecified number of amniotic sacs, first trimester: Secondary | ICD-10-CM

## 2016-10-03 ENCOUNTER — Encounter (HOSPITAL_COMMUNITY): Payer: Self-pay | Admitting: *Deleted

## 2016-10-05 ENCOUNTER — Ambulatory Visit (HOSPITAL_COMMUNITY)
Admission: RE | Admit: 2016-10-05 | Discharge: 2016-10-05 | Disposition: A | Discharge status: Home / Self Care | Payer: Medicaid Other | Source: Ambulatory Visit | Attending: Specialist | Admitting: Specialist

## 2016-10-05 ENCOUNTER — Other Ambulatory Visit (HOSPITAL_COMMUNITY): Payer: Self-pay | Admitting: Specialist

## 2016-10-05 ENCOUNTER — Other Ambulatory Visit (HOSPITAL_COMMUNITY): Payer: Self-pay | Admitting: *Deleted

## 2016-10-05 ENCOUNTER — Encounter (HOSPITAL_COMMUNITY): Payer: Self-pay

## 2016-10-05 DIAGNOSIS — Z3A10 10 weeks gestation of pregnancy: Secondary | ICD-10-CM | POA: Insufficient documentation

## 2016-10-05 DIAGNOSIS — O30001 Twin pregnancy, unspecified number of placenta and unspecified number of amniotic sacs, first trimester: Secondary | ICD-10-CM | POA: Diagnosis not present

## 2016-10-05 DIAGNOSIS — Z8751 Personal history of pre-term labor: Secondary | ICD-10-CM | POA: Insufficient documentation

## 2016-10-05 DIAGNOSIS — O30031 Twin pregnancy, monochorionic/diamniotic, first trimester: Secondary | ICD-10-CM

## 2016-10-05 DIAGNOSIS — O09891 Supervision of other high risk pregnancies, first trimester: Secondary | ICD-10-CM

## 2016-10-05 DIAGNOSIS — O09211 Supervision of pregnancy with history of pre-term labor, first trimester: Secondary | ICD-10-CM

## 2016-10-05 HISTORY — DX: Other specified health status: Z78.9

## 2016-10-05 NOTE — Consult Note (Signed)
MFM consult   21 yr old G2P0101 at 5452w5d (by 6 week ultrasound) with twin gestation and history of preterm delivery referred by Dr. Shawnie Ponsorn for consult.  Past OB hx: 2015 PPROM around 27 weeks; no symptoms prior; preterm labor and bleeding with vaginal delivery around 29 weeks- child is doing well  PMH: none PSH: none Medication: none Allergies: shellfish Social history: non contributory  Ultrasound today shows: Monochorionic/diamniotic twin gestation; the dividing membrane is seen. Fetal crown rump lengths are consistent with dating. Normal uterus; no adnexal masses seen.  I counseled the patient as follows:  1. Monochorionic/diamniotic twin gestation:  I discussed the following increased risks with monochorionic/diamniotic twin gestation:  1.  risk of preterm delivery: discussed risk of delivering prior to 37 weeks is approximately 60%; risk of delivering prior to 32 weeks is approximately 11%-  Preterm delivery may be spontaneous or iatrogenic from complications of pregnancy. Discussed given her history of PPROM and preterm delivery her risk of delivering preterm birth is likely higher than these numbers - I recommend close monitoring for the development of signs/symptoms of preterm labor/PPROM - recommend serial cervical length surveillance every 2 weeks from 16-24 weeks  2. Increased risk of gestational diabetes: recommend screening at 24-28 weeks 3. Discussed increased risk of preeclampsia: recommend close surveillance for the development of signs/symptoms of preeclampsia in the 2nd and 3rd trimester 4. Discussed the increased risk of requiring a Cesarean delivery 5. Discussed risk of fetal growth restriction: recommend serial fetal growth ultrasounds at least every 4 weeks 6. Discussed increased risk of congenital anomalies of approximately 3-6%: recommend fetal anatomic survey at 18-[redacted] weeks gestation; recommend fetal echocardiograms at 18-20 weeks 7. Discussed screening for fetal  aneuploidy: patient desires quad screen at 15-20 weeks; discussed limitations of screening tests in detecting fetal aneuploidy 8. Discussed increased risk of fetal death: recommend antenatal testing starting at [redacted] weeks gestation; recommend delivery at 34-[redacted] weeks gestation if pregnancy remains uncomplicated 9. Discussed risk of twin twin transfusion syndrome is approximately 10-15%. Recommend ultrasounds every 2 weeks starting at [redacted] weeks gestation to screen for twin twin transfusion syndrome. Briefly discussed if this were to develop there are potential interventions such as amnioreduction and laser therapy.  2. History of preterm delivery: - I discussed that with a history of preterm delivery the risk is increased in future pregnancies. Studies have shown that the frequency of recurrent preterm birth was 14 to 22 % after one preterm delivery, 28 to 42 % after two preterm deliveries, and 67% after three preterm deliveries. Term births decreased the risk of preterm birth in subsequent pregnancies. I discussed these numbers would be different in this pregnancy given she is pregnant with mono/di twins and we recommend delivery no later than 37 weeks and there is an increased risk of preterm delivery with twins regardless of history. I explained that I would recommend weekly injections of 17 OH progesterone (makena) as use from 16-36 weeks has been shown to decrease the risk of recurrent preterm birth by up to 40%. I discussed the data is not as clear in twin pregnancies but if the history is a preterm delivery in a singleton pregnancy we feel it is reasonable and likely beneficial to offer progesterone in subsequent pregnancies even if they are twin gestations- the risk reduction may be different however, but there is likely still a risk reduction.  I discussed we do not know of any adverse effects of progesterone however long term data is limited. Patient is in  agree ment and would like to start progesterone  injections at 16 weeks.  I recommend transvaginal cervical length surveillance at least every 2 weeks from 16-24 weeks. I recommend close surveillance for development of signs/symptoms of preterm labor/PPROM.  I discussed if she develops cervical shortening she may be a candidate for cerclage placement.  3. Recommend fetal anatomic surveys at 18-20 weeks.  I spent a total of 45 minutes with the patient of which >50% was in face to face consultation.  Eulis FosterKristen Erbie Arment, MD

## 2016-10-06 ENCOUNTER — Other Ambulatory Visit (HOSPITAL_COMMUNITY): Payer: Self-pay | Admitting: *Deleted

## 2016-10-06 DIAGNOSIS — O30039 Twin pregnancy, monochorionic/diamniotic, unspecified trimester: Secondary | ICD-10-CM

## 2016-11-16 ENCOUNTER — Encounter (HOSPITAL_COMMUNITY): Payer: Self-pay

## 2016-11-16 ENCOUNTER — Ambulatory Visit (HOSPITAL_COMMUNITY)
Admission: RE | Admit: 2016-11-16 | Discharge: 2016-11-16 | Disposition: A | Discharge status: Home / Self Care | Payer: Medicaid Other | Source: Ambulatory Visit | Attending: Specialist | Admitting: Specialist

## 2016-11-16 ENCOUNTER — Other Ambulatory Visit (HOSPITAL_COMMUNITY): Payer: Self-pay | Admitting: Obstetrics and Gynecology

## 2016-11-16 DIAGNOSIS — O30039 Twin pregnancy, monochorionic/diamniotic, unspecified trimester: Secondary | ICD-10-CM

## 2016-11-16 DIAGNOSIS — O30032 Twin pregnancy, monochorionic/diamniotic, second trimester: Secondary | ICD-10-CM | POA: Diagnosis not present

## 2016-11-16 DIAGNOSIS — O09212 Supervision of pregnancy with history of pre-term labor, second trimester: Secondary | ICD-10-CM

## 2016-11-16 DIAGNOSIS — Z3A16 16 weeks gestation of pregnancy: Secondary | ICD-10-CM

## 2016-11-16 IMAGING — US US MFM OB LIMITED
1 series · 13 of 28 positions shown · non-contrast
Comparison: none

[Series 1: us mfm ob limited · 44 acquisitions, 13 frames shown]
[im 2/44]
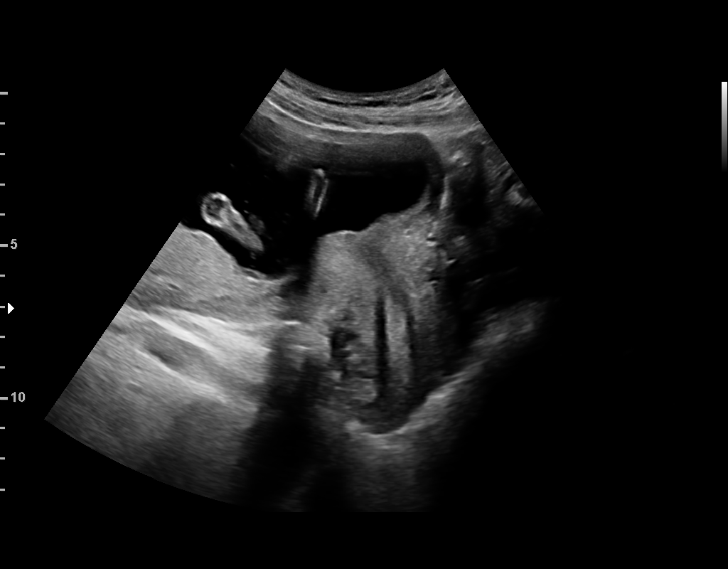
[im 5/44]
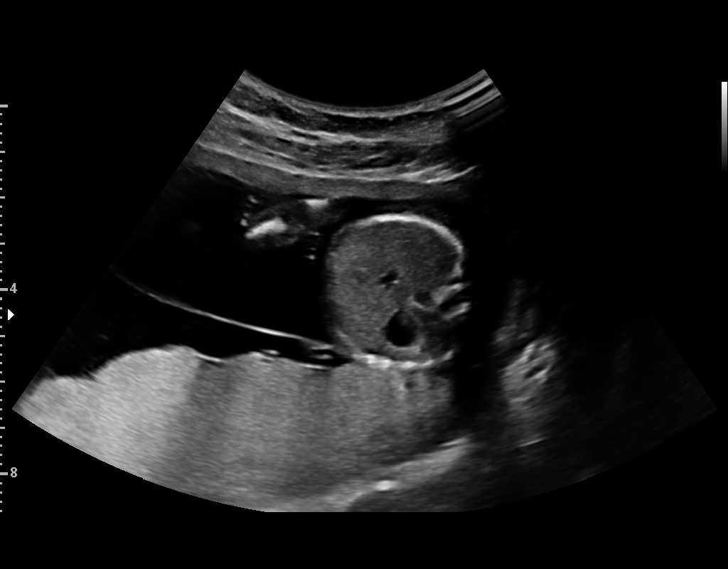
[im 8/44]
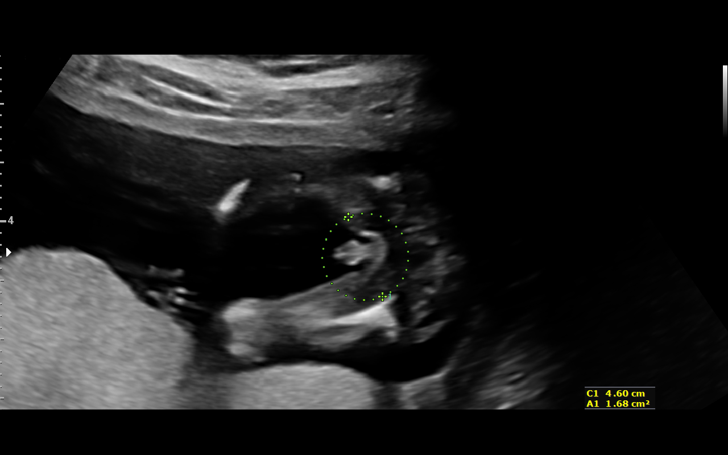
[im 12/44]
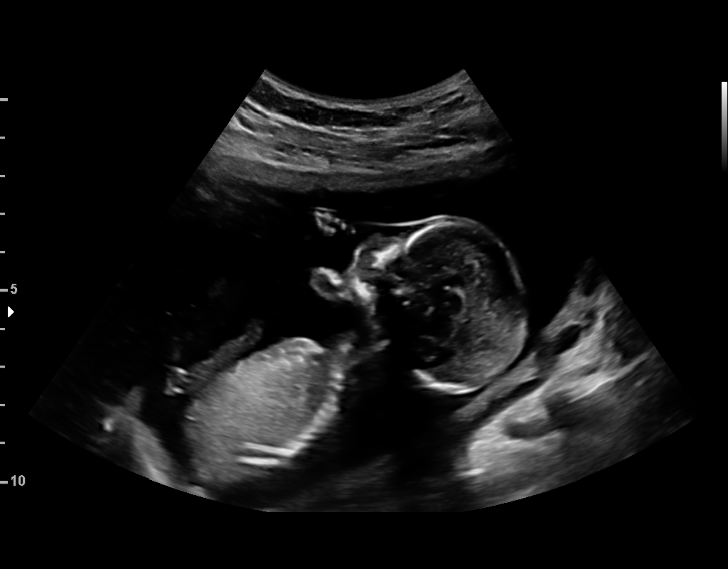
[im 15/44]
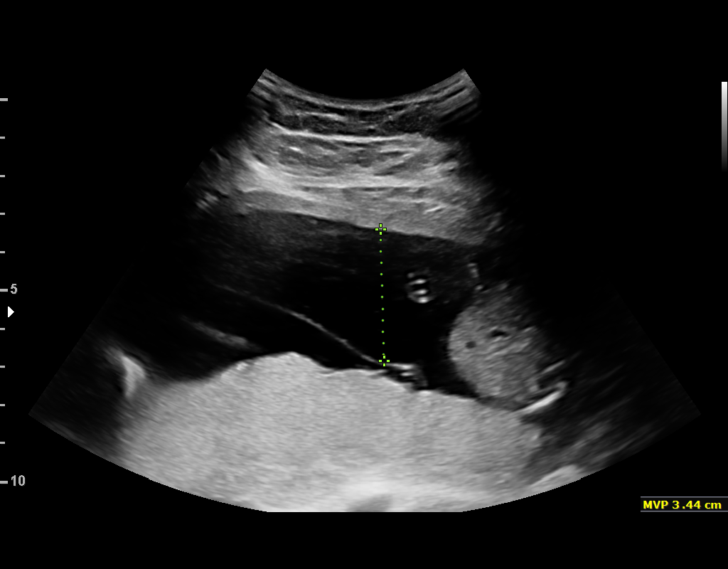
[im 18/44]
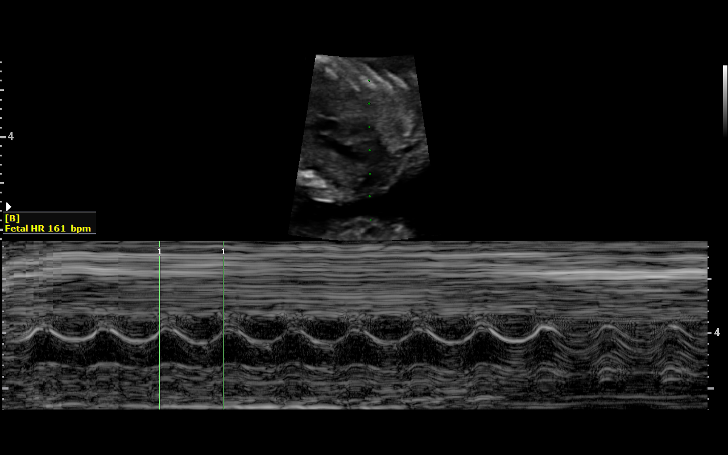
[im 23/44]
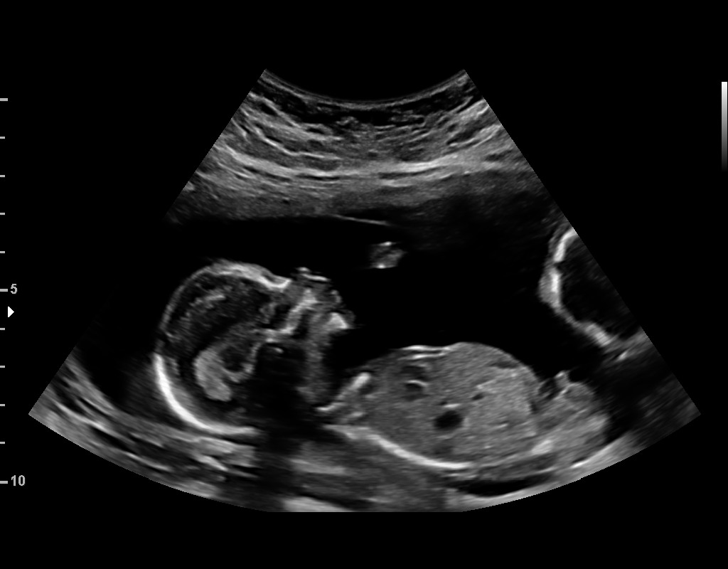
[im 26/44]
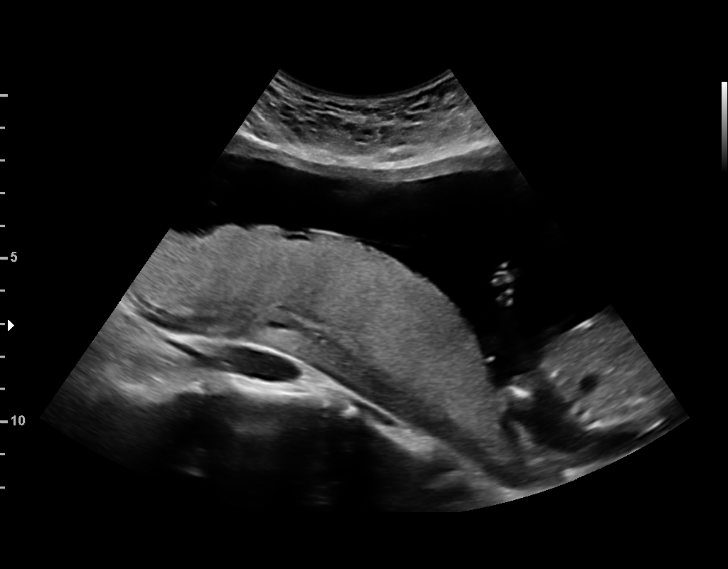
[im 29/44]
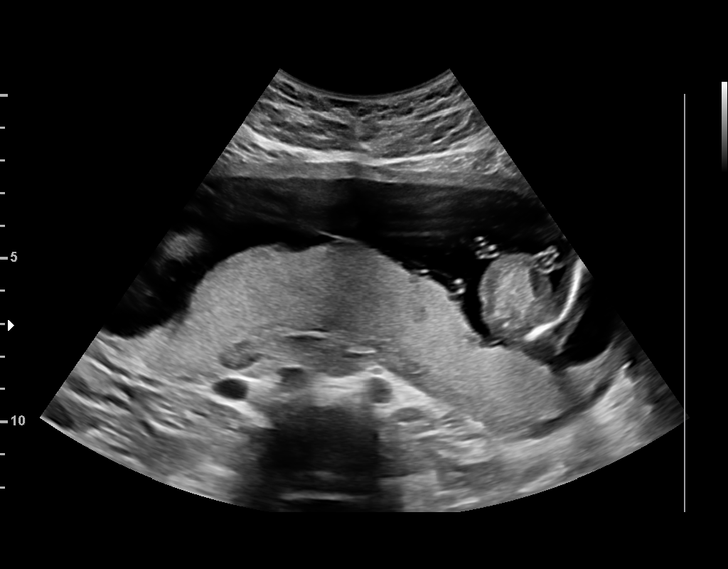
[im 32/44]
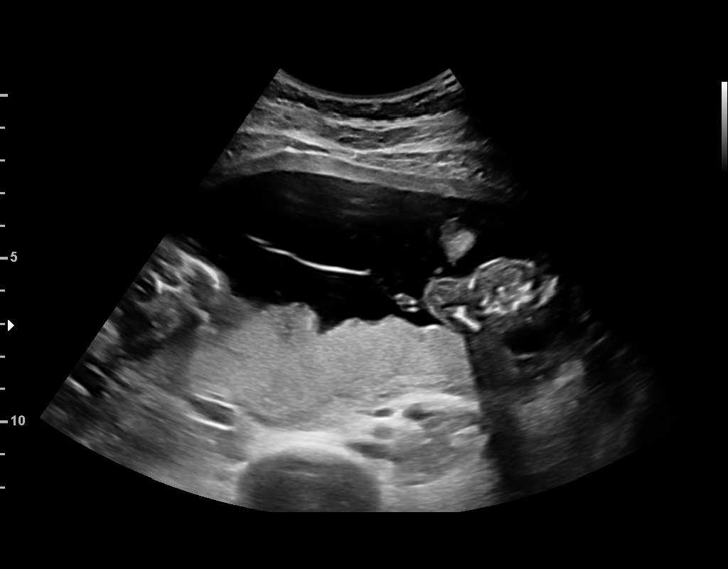
[im 36/44]
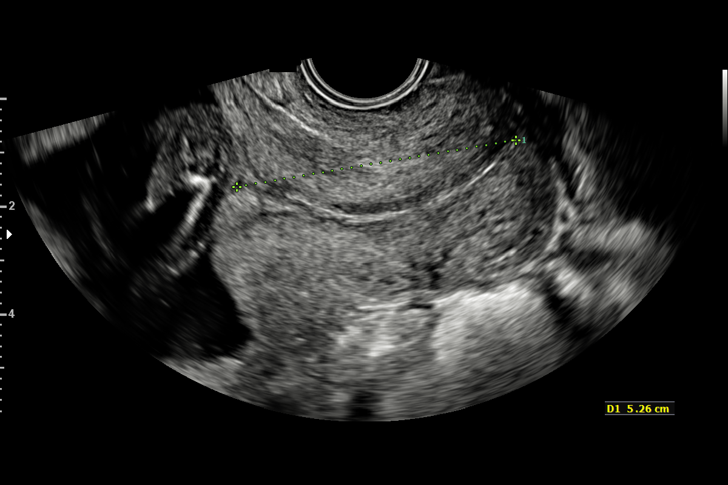
[im 39/44]
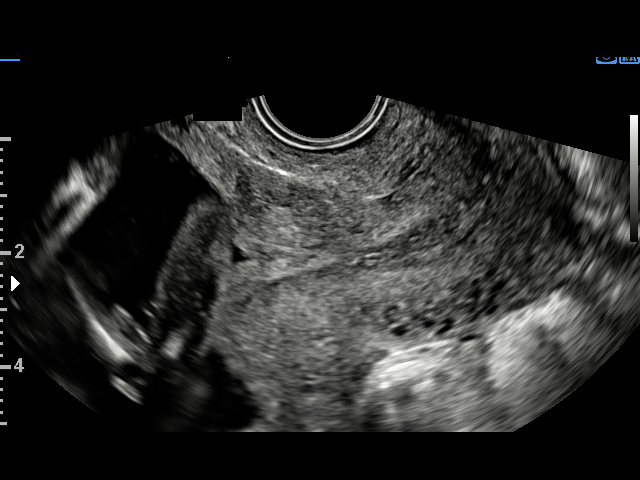
[im 42/44]
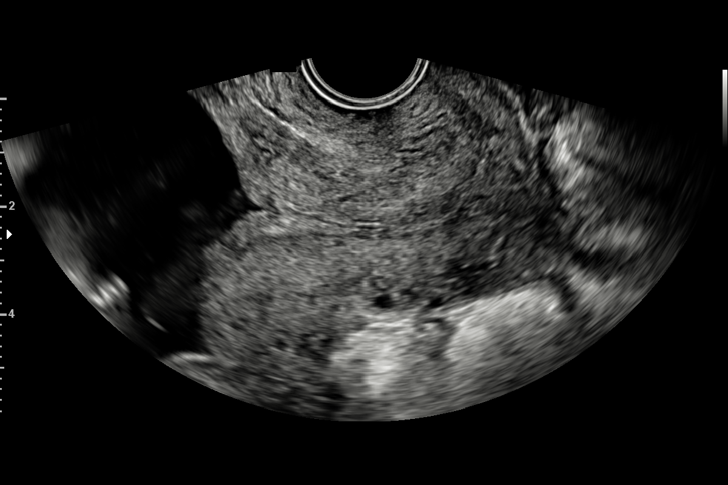

[13 of 28 positions shown; findings below may reference images not displayed]

[HOSPITAL][HOSPITAL]

1  [facility]            [PHONE_NUMBER]      [PHONE_NUMBER]     [PHONE_NUMBER]
2  [facility]            [PHONE_NUMBER]      [PHONE_NUMBER]     [PHONE_NUMBER]
Indications

16 weeks gestation of pregnancy
Poor obstetric history: Previous preterm       [IJ]
delivery, antepartum (27 weeks d/t PROM)
Twin pregnancy, [facility], second trimester      [IJ]
OB History

Blood Type:            Height:  5'6"   Weight (lb):  148      BMI:
Gravidity:    2         Term:   0        Prem:   1        SAB:   0
TOP:          0       Ectopic:  0        Living: 1
Fetal Evaluation (Fetus A)

Num Of Fetuses:     2
Fetal Heart         164
Rate(bpm):
Cardiac Activity:   Observed
Fetal Lie:          Maternal left side
Presentation:       Cephalic
Placenta:           Posterior, above cervical os
Membrane Desc:      Dividing Membrane seen

Amniotic Fluid
AFI FV:      Subjectively within normal limits

Largest Pocket(cm)
3.44
Gestational Age (Fetus A)
LMP:           18w 4d       Date:   [DATE]                 EDD:   [DATE]
Best:          16w 5d    Det. By:   Early Ultrasound         EDD:   [DATE]
([DATE])
Anatomy (Fetus A)

Stomach:               Appears normal, left   Bladder:                Appears normal
sided

Fetal Evaluation (Fetus B)

Num Of Fetuses:     2
Fetal Heart         161
Rate(bpm):
Cardiac Activity:   Observed
Fetal Lie:          Maternal right side
Presentation:       Breech
Placenta:           Posterior, above cervical os
Membrane Desc:      Dividing Membrane seen

Amniotic Fluid
AFI FV:      Subjectively within normal limits

Largest Pocket(cm)
4.68
Gestational Age (Fetus B)

LMP:           18w 4d       Date:   [DATE]                 EDD:   [DATE]
Best:          16w 5d    Det. By:   Early Ultrasound         EDD:   [DATE]
([DATE])
Anatomy (Fetus B)

Stomach:               Appears normal, left   Bladder:                Appears normal
sided
Cervix Uterus Adnexa

Cervix
Appears closed, without funnelling.
Impression

Monochorionic/diamniotic twin pregnancy at 16+6 weeks
Normal amniotic fluid volume x 2
No evidence of TTTS
EV views of cervix: normal length without funneling; mild
contraction in LUS
Recommendations

Anatomic surveys and CL in 2 weeks

## 2016-11-30 ENCOUNTER — Encounter (HOSPITAL_COMMUNITY): Payer: Self-pay

## 2016-11-30 ENCOUNTER — Ambulatory Visit (HOSPITAL_COMMUNITY)
Admission: RE | Admit: 2016-11-30 | Discharge: 2016-11-30 | Disposition: A | Discharge status: Home / Self Care | Payer: Medicaid Other | Source: Ambulatory Visit | Attending: Specialist | Admitting: Specialist

## 2016-11-30 DIAGNOSIS — Z3A18 18 weeks gestation of pregnancy: Secondary | ICD-10-CM | POA: Insufficient documentation

## 2016-11-30 DIAGNOSIS — O30039 Twin pregnancy, monochorionic/diamniotic, unspecified trimester: Secondary | ICD-10-CM | POA: Diagnosis present

## 2016-12-14 ENCOUNTER — Other Ambulatory Visit (HOSPITAL_COMMUNITY): Payer: Self-pay | Admitting: Obstetrics and Gynecology

## 2016-12-14 ENCOUNTER — Ambulatory Visit (HOSPITAL_COMMUNITY)
Admission: RE | Admit: 2016-12-14 | Discharge: 2016-12-14 | Disposition: A | Discharge status: Home / Self Care | Payer: Medicaid Other | Source: Ambulatory Visit | Attending: Specialist | Admitting: Specialist

## 2016-12-14 ENCOUNTER — Ambulatory Visit (HOSPITAL_COMMUNITY): Payer: No Typology Code available for payment source

## 2016-12-14 ENCOUNTER — Encounter (HOSPITAL_COMMUNITY): Payer: Self-pay

## 2016-12-14 DIAGNOSIS — O09899 Supervision of other high risk pregnancies, unspecified trimester: Secondary | ICD-10-CM

## 2016-12-14 DIAGNOSIS — O09219 Supervision of pregnancy with history of pre-term labor, unspecified trimester: Secondary | ICD-10-CM

## 2016-12-14 DIAGNOSIS — O30039 Twin pregnancy, monochorionic/diamniotic, unspecified trimester: Secondary | ICD-10-CM

## 2016-12-14 DIAGNOSIS — O30032 Twin pregnancy, monochorionic/diamniotic, second trimester: Secondary | ICD-10-CM | POA: Insufficient documentation

## 2016-12-14 DIAGNOSIS — Z3A20 20 weeks gestation of pregnancy: Secondary | ICD-10-CM | POA: Diagnosis not present

## 2016-12-14 DIAGNOSIS — O09212 Supervision of pregnancy with history of pre-term labor, second trimester: Secondary | ICD-10-CM | POA: Diagnosis not present

## 2016-12-28 ENCOUNTER — Encounter (HOSPITAL_COMMUNITY): Payer: Self-pay

## 2016-12-28 ENCOUNTER — Ambulatory Visit (HOSPITAL_COMMUNITY)
Admission: RE | Admit: 2016-12-28 | Discharge: 2016-12-28 | Disposition: A | Discharge status: Home / Self Care | Payer: Medicaid Other | Source: Ambulatory Visit | Attending: Specialist | Admitting: Specialist

## 2016-12-28 ENCOUNTER — Other Ambulatory Visit (HOSPITAL_COMMUNITY): Payer: Self-pay

## 2016-12-28 DIAGNOSIS — O30039 Twin pregnancy, monochorionic/diamniotic, unspecified trimester: Secondary | ICD-10-CM

## 2016-12-28 DIAGNOSIS — O09212 Supervision of pregnancy with history of pre-term labor, second trimester: Secondary | ICD-10-CM | POA: Insufficient documentation

## 2016-12-28 DIAGNOSIS — O321XX2 Maternal care for breech presentation, fetus 2: Secondary | ICD-10-CM | POA: Diagnosis not present

## 2016-12-28 DIAGNOSIS — O30032 Twin pregnancy, monochorionic/diamniotic, second trimester: Secondary | ICD-10-CM | POA: Diagnosis present

## 2016-12-28 DIAGNOSIS — Z3A22 22 weeks gestation of pregnancy: Secondary | ICD-10-CM | POA: Diagnosis not present

## 2016-12-28 DIAGNOSIS — O321XX1 Maternal care for breech presentation, fetus 1: Secondary | ICD-10-CM | POA: Diagnosis not present

## 2016-12-28 NOTE — Addendum Note (Signed)
Encounter addended by: Genevie CheshireWaken, Lucylle Foulkes M, RT on: 12/28/2016  1:22 PM<BR>    Actions taken: Imaging Exam ended

## 2017-01-11 ENCOUNTER — Other Ambulatory Visit (HOSPITAL_COMMUNITY): Payer: Self-pay | Admitting: Obstetrics and Gynecology

## 2017-01-11 ENCOUNTER — Ambulatory Visit (HOSPITAL_COMMUNITY)
Admission: RE | Admit: 2017-01-11 | Discharge: 2017-01-11 | Disposition: A | Discharge status: Home / Self Care | Payer: Medicaid Other | Source: Ambulatory Visit | Attending: Specialist | Admitting: Specialist

## 2017-01-11 ENCOUNTER — Encounter (HOSPITAL_COMMUNITY): Payer: Self-pay

## 2017-01-11 DIAGNOSIS — Z3A24 24 weeks gestation of pregnancy: Secondary | ICD-10-CM | POA: Diagnosis not present

## 2017-01-11 DIAGNOSIS — O30039 Twin pregnancy, monochorionic/diamniotic, unspecified trimester: Secondary | ICD-10-CM

## 2017-01-11 DIAGNOSIS — O09212 Supervision of pregnancy with history of pre-term labor, second trimester: Secondary | ICD-10-CM

## 2017-01-25 ENCOUNTER — Other Ambulatory Visit (HOSPITAL_COMMUNITY): Payer: Self-pay | Admitting: Obstetrics and Gynecology

## 2017-01-25 ENCOUNTER — Ambulatory Visit (HOSPITAL_COMMUNITY)
Admission: RE | Admit: 2017-01-25 | Discharge: 2017-01-25 | Disposition: A | Discharge status: Home / Self Care | Payer: Medicaid Other | Source: Ambulatory Visit | Attending: Specialist | Admitting: Specialist

## 2017-01-25 ENCOUNTER — Encounter (HOSPITAL_COMMUNITY): Payer: Self-pay

## 2017-01-25 ENCOUNTER — Other Ambulatory Visit (HOSPITAL_COMMUNITY): Payer: Self-pay | Admitting: *Deleted

## 2017-01-25 DIAGNOSIS — O09212 Supervision of pregnancy with history of pre-term labor, second trimester: Secondary | ICD-10-CM | POA: Diagnosis not present

## 2017-01-25 DIAGNOSIS — Z3A26 26 weeks gestation of pregnancy: Secondary | ICD-10-CM | POA: Diagnosis not present

## 2017-01-25 DIAGNOSIS — O30032 Twin pregnancy, monochorionic/diamniotic, second trimester: Secondary | ICD-10-CM | POA: Insufficient documentation

## 2017-01-25 DIAGNOSIS — O30039 Twin pregnancy, monochorionic/diamniotic, unspecified trimester: Secondary | ICD-10-CM

## 2017-01-25 DIAGNOSIS — Z362 Encounter for other antenatal screening follow-up: Secondary | ICD-10-CM

## 2017-01-25 DIAGNOSIS — O321XX2 Maternal care for breech presentation, fetus 2: Secondary | ICD-10-CM | POA: Diagnosis not present

## 2017-01-25 NOTE — ED Notes (Signed)
Leaking small amounts of clear fluid x one week. Denies bleeding or contractions, + fetal movement.

## 2017-02-08 ENCOUNTER — Other Ambulatory Visit (HOSPITAL_COMMUNITY): Payer: Self-pay | Admitting: Maternal and Fetal Medicine

## 2017-02-08 ENCOUNTER — Ambulatory Visit (HOSPITAL_COMMUNITY)
Admission: RE | Admit: 2017-02-08 | Discharge: 2017-02-08 | Disposition: A | Discharge status: Home / Self Care | Payer: Medicaid Other | Source: Ambulatory Visit | Attending: Specialist | Admitting: Specialist

## 2017-02-08 ENCOUNTER — Encounter (HOSPITAL_COMMUNITY): Payer: Self-pay

## 2017-02-08 DIAGNOSIS — Z3A28 28 weeks gestation of pregnancy: Secondary | ICD-10-CM | POA: Diagnosis not present

## 2017-02-08 DIAGNOSIS — O30032 Twin pregnancy, monochorionic/diamniotic, second trimester: Secondary | ICD-10-CM | POA: Diagnosis not present

## 2017-02-08 DIAGNOSIS — O09219 Supervision of pregnancy with history of pre-term labor, unspecified trimester: Secondary | ICD-10-CM | POA: Insufficient documentation

## 2017-02-08 DIAGNOSIS — O30039 Twin pregnancy, monochorionic/diamniotic, unspecified trimester: Secondary | ICD-10-CM

## 2017-02-08 DIAGNOSIS — O09212 Supervision of pregnancy with history of pre-term labor, second trimester: Secondary | ICD-10-CM

## 2017-02-14 ENCOUNTER — Inpatient Hospital Stay (HOSPITAL_BASED_OUTPATIENT_CLINIC_OR_DEPARTMENT_OTHER)
Admission: EM | Admit: 2017-02-14 | Discharge: 2017-02-14 | Disposition: A | Discharge status: Other Acute Inpatient Hospital | Payer: Medicaid Other | Attending: Emergency Medicine | Admitting: Emergency Medicine

## 2017-02-14 ENCOUNTER — Other Ambulatory Visit: Payer: Self-pay

## 2017-02-14 ENCOUNTER — Encounter (HOSPITAL_BASED_OUTPATIENT_CLINIC_OR_DEPARTMENT_OTHER): Payer: Self-pay

## 2017-02-14 DIAGNOSIS — R1084 Generalized abdominal pain: Secondary | ICD-10-CM | POA: Diagnosis not present

## 2017-02-14 DIAGNOSIS — O30003 Twin pregnancy, unspecified number of placenta and unspecified number of amniotic sacs, third trimester: Secondary | ICD-10-CM | POA: Diagnosis not present

## 2017-02-14 DIAGNOSIS — O4703 False labor before 37 completed weeks of gestation, third trimester: Secondary | ICD-10-CM | POA: Diagnosis not present

## 2017-02-14 DIAGNOSIS — O26893 Other specified pregnancy related conditions, third trimester: Secondary | ICD-10-CM | POA: Diagnosis not present

## 2017-02-14 DIAGNOSIS — Z3A29 29 weeks gestation of pregnancy: Secondary | ICD-10-CM | POA: Diagnosis not present

## 2017-02-14 DIAGNOSIS — N898 Other specified noninflammatory disorders of vagina: Secondary | ICD-10-CM | POA: Diagnosis not present

## 2017-02-14 DIAGNOSIS — Z79899 Other long term (current) drug therapy: Secondary | ICD-10-CM | POA: Diagnosis not present

## 2017-02-14 LAB — URINALYSIS, ROUTINE W REFLEX MICROSCOPIC
Bilirubin Urine: NEGATIVE
GLUCOSE, UA: NEGATIVE mg/dL
Hgb urine dipstick: NEGATIVE
Ketones, ur: NEGATIVE mg/dL
NITRITE: NEGATIVE
PH: 6.5 (ref 5.0–8.0)
PROTEIN: NEGATIVE mg/dL
Specific Gravity, Urine: 1.025 (ref 1.005–1.030)

## 2017-02-14 LAB — CBC WITH DIFFERENTIAL/PLATELET
BASOS PCT: 0 %
Basophils Absolute: 0 10*3/uL (ref 0.0–0.1)
EOS ABS: 0 10*3/uL (ref 0.0–0.7)
EOS PCT: 0 %
HCT: 26.7 % — ABNORMAL LOW (ref 36.0–46.0)
HEMOGLOBIN: 8.5 g/dL — AB (ref 12.0–15.0)
Lymphocytes Relative: 18 %
Lymphs Abs: 1 10*3/uL (ref 0.7–4.0)
MCH: 25.1 pg — ABNORMAL LOW (ref 26.0–34.0)
MCHC: 31.8 g/dL (ref 30.0–36.0)
MCV: 79 fL (ref 78.0–100.0)
MONO ABS: 0.5 10*3/uL (ref 0.1–1.0)
MONOS PCT: 9 %
NEUTROS PCT: 73 %
Neutro Abs: 4.2 10*3/uL (ref 1.7–7.7)
PLATELETS: 176 10*3/uL (ref 150–400)
RBC: 3.38 MIL/uL — ABNORMAL LOW (ref 3.87–5.11)
RDW: 14.9 % (ref 11.5–15.5)
WBC: 5.7 10*3/uL (ref 4.0–10.5)

## 2017-02-14 LAB — PROTIME-INR
INR: 1.04
Prothrombin Time: 13.5 seconds (ref 11.4–15.2)

## 2017-02-14 LAB — URINALYSIS, MICROSCOPIC (REFLEX)

## 2017-02-14 LAB — COMPREHENSIVE METABOLIC PANEL
ALBUMIN: 2.8 g/dL — AB (ref 3.5–5.0)
ALT: 13 U/L — ABNORMAL LOW (ref 14–54)
ANION GAP: 6 (ref 5–15)
AST: 25 U/L (ref 15–41)
Alkaline Phosphatase: 114 U/L (ref 38–126)
BUN: 6 mg/dL (ref 6–20)
CO2: 23 mmol/L (ref 22–32)
Calcium: 8.4 mg/dL — ABNORMAL LOW (ref 8.9–10.3)
Chloride: 106 mmol/L (ref 101–111)
Creatinine, Ser: 0.43 mg/dL — ABNORMAL LOW (ref 0.44–1.00)
GFR calc Af Amer: >60 mL/min (ref >60)
GFR calc non Af Amer: >60 mL/min (ref >60)
GLUCOSE: 101 mg/dL — AB (ref 65–99)
POTASSIUM: 3.4 mmol/L — AB (ref 3.5–5.1)
SODIUM: 135 mmol/L (ref 135–145)
Total Bilirubin: 0.4 mg/dL (ref 0.3–1.2)
Total Protein: 7 g/dL (ref 6.5–8.1)

## 2017-02-14 MED ORDER — TERBUTALINE SULFATE 1 MG/ML IJ SOLN
0.2500 mg | Freq: Once | INTRAMUSCULAR | Status: AC
  Administered 2017-02-14: 0.25 mg via SUBCUTANEOUS
  Filled 2017-02-14: qty 1

## 2017-02-14 NOTE — ED Triage Notes (Addendum)
Pt c/o "thick and stringy" liquid x 1 down leg approx 230pm-denies vaginal d/c or bleeding at this time-c/o abd pain-pt is [redacted] weeks pregnant with twins-NAD

## 2017-02-14 NOTE — MAU Provider Note (Signed)
Chief Complaint:  Abdominal Pain ([redacted] weeks pregnant-multiple/twins) and Rupture of Membranes       HPI: Gabrielle Lopez is a 21 y.o. G2P0101 at 33w5dwho presents to maternity admissions reporting passage of thick mucous from vagina earlier today.  No liquid.  No bleeding. Does not feel any contractions. . She reports good fetal movement, denies vaginal bleeding, vaginal itching/burning, urinary symptoms, h/a, dizziness, n/v, diarrhea, constipation or fever/chills.    She was [facility] at [facility].  Was not evaluated for ruptured membranes. Patient of Dr Shawnie Pons.    Vaginal Discharge  The patient's primary symptoms include vaginal discharge. The patient's pertinent negatives include no genital itching, genital lesions, genital odor, pelvic pain or vaginal bleeding. This is a new problem. The current episode started today. The problem occurs rarely. The problem has been resolved. The patient is experiencing no pain. She is pregnant. Pertinent negatives include no constipation, diarrhea, dysuria, fever, headaches, nausea or vomiting. The vaginal discharge was mucoid. There has been no bleeding. She has not been passing clots. She has not been passing tissue. Nothing aggravates the symptoms. She has tried nothing for the symptoms.   RN Note: G2P1 @ 29.[redacted] wksga twin IUP. Presents to triage for r/o srom. States had stringly mucus discharge. Denies bleeding. +FM noted. EFM applied.     Past Medical History: Past Medical History:  Diagnosis Date  . Herpes   . Medical history non-contributory     Past obstetric history: OB History  Gravida Para Term Preterm AB Living  2 1   1   1   SAB TAB Ectopic Multiple Live Births               # Outcome Date GA Lbr Len/2nd Weight Sex Delivery Anes PTL Lv  2 Current           1 Preterm  [redacted]w[redacted]d             Past Surgical History: Past Surgical History:  Procedure Laterality Date  . NO PAST SURGERIES      Family History: Family History  Family  history unknown: Yes    Social History: Social History   Tobacco Use  . Smoking status: Never Smoker  . Smokeless tobacco: Never Used  Substance Use Topics  . Alcohol use: No    Frequency: Never  . Drug use: No    Allergies:  Allergies  Allergen Reactions  . Shellfish Allergy     Meds:  Medications Prior to Admission  Medication Sig Dispense Refill Last Dose  . IRON PO Take by mouth.     . Doxylamine-Pyridoxine (DICLEGIS PO) Take by mouth.   Not Taking  . Prenatal Vit-Fe Fumarate-FA (PRENATAL COMPLETE) 14-0.4 MG TABS Take 1 tablet by mouth every morning. (Patient not taking: Reported on 02/08/2017) 30 each 0 Not Taking    I have reviewed patient's Past Medical Hx, Surgical Hx, Family Hx, Social Hx, medications and allergies.   ROS:  Review of Systems  Constitutional: Negative for fever.  Gastrointestinal: Negative for constipation, diarrhea, nausea and vomiting.  Genitourinary: Positive for vaginal discharge. Negative for dysuria and pelvic pain.  Neurological: Negative for headaches.   Other systems negative  Physical Exam   Patient Vitals for the past 24 hrs:  BP Temp Temp src Pulse Resp SpO2  02/14/17 1937 128/79 98.1 F (36.7 C) Oral 91 20 100 %  02/14/17 1845 121/83 - - 88 19 100 %  02/14/17 1640 (!) 137/99 98.2 F (36.8 C) Oral Marland Kitchen)  117 20 100 %   Constitutional: Well-developed, well-nourished female in no acute distress.  Cardiovascular: normal rate and rhythm Respiratory: normal effort, clear to auscultation bilaterally GI: Abd soft, non-tender, gravid appropriate for gestational age.   No rebound or guarding. MS: Extremities nontender, no edema, normal ROM Neurologic: Alert and oriented x 4.  GU: Neg CVAT.  PELVIC EXAM: Cervix pink, visually closed, without lesion, scant white creamy discharge, vaginal walls and external genitalia normal Dilation: Closed Effacement (%): Thick Cervical Position: Posterior Station: Ballotable Exam by:: Gabrielle Lopez  CNM  FHT:  Baseline 140s x 2 , moderate variability, accelerations present, no decelerations Contractions: q 6 mins Irregular  These are mild and not felt by patient   Labs: Results for orders placed or performed during the hospital encounter of 02/14/17 (from the past 24 hour(s))  CBC with Differential/Platelet     Status: Abnormal   Collection Time: 02/14/17  4:52 PM  Result Value Ref Range   WBC 5.7 4.0 - 10.5 K/uL   RBC 3.38 (L) 3.87 - 5.11 MIL/uL   Hemoglobin 8.5 (L) 12.0 - 15.0 g/dL   HCT 40.926.7 (L) 81.136.0 - 91.446.0 %   MCV 79.0 78.0 - 100.0 fL   MCH 25.1 (L) 26.0 - 34.0 pg   MCHC 31.8 30.0 - 36.0 g/dL   RDW 78.214.9 95.611.5 - 21.315.5 %   Platelets 176 150 - 400 K/uL   Neutrophils Relative % 73 %   Neutro Abs 4.2 1.7 - 7.7 K/uL   Lymphocytes Relative 18 %   Lymphs Abs 1.0 0.7 - 4.0 K/uL   Monocytes Relative 9 %   Monocytes Absolute 0.5 0.1 - 1.0 K/uL   Eosinophils Relative 0 %   Eosinophils Absolute 0.0 0.0 - 0.7 K/uL   Basophils Relative 0 %   Basophils Absolute 0.0 0.0 - 0.1 K/uL  Comprehensive metabolic panel     Status: Abnormal   Collection Time: 02/14/17  4:52 PM  Result Value Ref Range   Sodium 135 135 - 145 mmol/L   Potassium 3.4 (L) 3.5 - 5.1 mmol/L   Chloride 106 101 - 111 mmol/L   CO2 23 22 - 32 mmol/L   Glucose, Bld 101 (H) 65 - 99 mg/dL   BUN 6 6 - 20 mg/dL   Creatinine, Ser 0.860.43 (L) 0.44 - 1.00 mg/dL   Calcium 8.4 (L) 8.9 - 10.3 mg/dL   Total Protein 7.0 6.5 - 8.1 g/dL   Albumin 2.8 (L) 3.5 - 5.0 g/dL   AST 25 15 - 41 U/L   ALT 13 (L) 14 - 54 U/L   Alkaline Phosphatase 114 38 - 126 U/L   Total Bilirubin 0.4 0.3 - 1.2 mg/dL   GFR calc non Af Amer >60 >60 mL/min   GFR calc Af Amer >60 >60 mL/min   Anion gap 6 5 - 15  Urinalysis, Routine w reflex microscopic     Status: Abnormal   Collection Time: 02/14/17  4:52 PM  Result Value Ref Range   Color, Urine YELLOW YELLOW   APPearance CLOUDY (A) CLEAR   Specific Gravity, Urine 1.025 1.005 - 1.030   pH 6.5 5.0 - 8.0    Glucose, UA NEGATIVE NEGATIVE mg/dL   Hgb urine dipstick NEGATIVE NEGATIVE   Bilirubin Urine NEGATIVE NEGATIVE   Ketones, ur NEGATIVE NEGATIVE mg/dL   Protein, ur NEGATIVE NEGATIVE mg/dL   Nitrite NEGATIVE NEGATIVE   Leukocytes, UA MODERATE (A) NEGATIVE  Protime-INR     Status: None  Collection Time: 02/14/17  4:52 PM  Result Value Ref Range   Prothrombin Time 13.5 11.4 - 15.2 seconds   INR 1.04   Urinalysis, Microscopic (reflex)     Status: Abnormal   Collection Time: 02/14/17  4:52 PM  Result Value Ref Range   RBC / HPF 0-5 0 - 5 RBC/hpf   WBC, UA 6-30 0 - 5 WBC/hpf   Bacteria, UA MANY (A) NONE SEEN   Squamous Epithelial / LPF 6-30 (A) NONE SEEN  ABO/Rh     Status: None   Collection Time: 02/14/17  5:00 PM  Result Value Ref Range   ABO/RH(D)      B POS Performed at [facility], [address] [place], [place] [address]    --/--/B POS Performed at [facility], [address] [place], [place] [address]  (12/05 1700)  Imaging:    MAU Course/MDM: I have ordered labs and reviewed results.  NST reviewed and found reactive with both twins Consult Dr Despina HiddenEure with presentation, exam findings and test results. He recommends a dose of Terbutaline for contractions Terbutaline did stop contractions. Patient does not feel any at all.  Discussed PTL precautions Treatments in MAU included Terbutaline, SSE.    Assessment: 1. Generalized abdominal pain   2. [redacted] weeks gestation of pregnancy   3.    Mucous vaginal discharge, no evidence of ruptured membranes 4.    Preterm contractions without cervical change  Plan: Discharge home Preterm Labor precautions and fetal kick counts Follow up in Office with Dr Shawnie Ponsorn for prenatal visits and recheck of cervix Encouraged to return here or to other Urgent Care/ED if she develops worsening of symptoms, increase in pain, fever, or other concerning symptoms.   Pt stable at time of discharge.  Wynelle BourgeoisMarie Wirt CNM, MSN Certified  Nurse-Midwife 02/14/2017 8:14 PM

## 2017-02-14 NOTE — ED Notes (Signed)
Spoke with Denny PeonErin, Rapid Response RN at [facility] in [facility] to transfer. MD made aware.

## 2017-02-14 NOTE — ED Notes (Signed)
Pt placed on TOCO monitor and [facility] called.

## 2017-02-14 NOTE — Discharge Instructions (Signed)

## 2017-02-14 NOTE — ED Provider Notes (Signed)
[facility] EMERGENCY DEPARTMENT Provider Note   CSN: [id number] Arrival date & time: 02/14/17  1629     History   Chief Complaint Chief Complaint  Patient presents with  . Abdominal Pain    [redacted] weeks pregnant-multiple/twins    HPI Gabrielle Lopez is a 21 y.o. female. Chief complaint pregnant, 29 weeks, abdominal pain, leakage of fluid.  HPI:  21 year old female. History of preterm labor with her son at 1029 weeks. She is currently pregnant twins. She is [facility] obstetrician Dr. Shawnie Ponsorn.  About 1 hour ago she felt a rush of fluid down her leg. States he was a little bit of blood and some mucus. Does not feel as though she urinated. Has not had any ongoing fluids since then.  Has some minimal lower abdominal pain current. Is not lightheaded or dizzy. No other symptoms.  Past Medical History:  Diagnosis Date  . Herpes   . Medical history non-contributory     There are no active problems to display for this patient.   Past Surgical History:  Procedure Laterality Date  . NO PAST SURGERIES      OB History    Gravida Para Term Preterm AB Living   2 1   1   1    SAB TAB Ectopic Multiple Live Births                   Home Medications    Prior to Admission medications   Medication Sig Start Date End Date Taking? Authorizing Provider  IRON PO Take by mouth.   Yes [provider]  Doxylamine-Pyridoxine (DICLEGIS PO) Take by mouth.    [provider]  Prenatal Vit-Fe Fumarate-FA (PRENATAL COMPLETE) 14-0.4 MG TABS Take 1 tablet by mouth every morning. Patient not taking: Reported on 02/08/2017 11/24/14   Renne CriglerGeiple, Joshua, PA-C    Family History No family history on file.  Social History Social History   Tobacco Use  . Smoking status: Never Smoker  . Smokeless tobacco: Never Used  Substance Use Topics  . Alcohol use: No    Frequency: Never  . Drug use: No     Allergies   Shellfish allergy   Review of Systems Review of  Systems  Constitutional: Negative for appetite change, chills, diaphoresis, fatigue and fever.  HENT: Negative for mouth sores, sore throat and trouble swallowing.   Eyes: Negative for visual disturbance.  Respiratory: Negative for cough, chest tightness, shortness of breath and wheezing.   Cardiovascular: Negative for chest pain.  Gastrointestinal: Positive for abdominal pain. Negative for abdominal distention, diarrhea, nausea and vomiting.  Endocrine: Negative for polydipsia, polyphagia and polyuria.  Genitourinary: Negative for dysuria, frequency and hematuria.       Vaginal fluid leakage.  Musculoskeletal: Negative for gait problem.  Skin: Negative for color change, pallor and rash.  Neurological: Negative for dizziness, syncope, light-headedness and headaches.  Hematological: Does not bruise/bleed easily.  Psychiatric/Behavioral: Negative for behavioral problems and confusion.     Physical Exam Updated Vital Signs BP (!) 137/99 (BP Location: Right Arm)   Pulse (!) 117   Temp 98.2 F (36.8 C) (Oral)   Resp 20   SpO2 100%   Physical Exam  Constitutional: She is oriented to person, place, and time. She appears well-developed and well-nourished. No distress.  HENT:  Head: Normocephalic.  Eyes: Conjunctivae are normal. Pupils are equal, round, and reactive to light. No scleral icterus.  Neck: Normal range of motion. Neck supple. No thyromegaly  present.  Cardiovascular: Normal rate and regular rhythm. Exam reveals no gallop and no friction rub.  No murmur heard. Pulmonary/Chest: Effort normal and breath sounds normal. No respiratory distress. She has no wheezes. She has no rales.  Abdominal: Soft. Bowel sounds are normal. She exhibits no distension. There is no tenderness. There is no rebound.  Gravid. No tenderness.  Musculoskeletal: Normal range of motion.  Neurological: She is alert and oriented to person, place, and time.  Skin: Skin is warm and dry. No rash noted.    Psychiatric: She has a normal mood and affect. Her behavior is normal.     ED Treatments / Results  Labs (all labs ordered are listed, but only abnormal results are displayed) Labs Reviewed  CBC WITH DIFFERENTIAL/PLATELET - Abnormal; Notable for the following components:      Result Value   RBC 3.38 (*)    Hemoglobin 8.5 (*)    HCT 26.7 (*)    MCH 25.1 (*)    All other components within normal limits  COMPREHENSIVE METABOLIC PANEL - Abnormal; Notable for the following components:   Potassium 3.4 (*)    Glucose, Bld 101 (*)    Creatinine, Ser 0.43 (*)    Calcium 8.4 (*)    Albumin 2.8 (*)    ALT 13 (*)    All other components within normal limits  URINALYSIS, ROUTINE W REFLEX MICROSCOPIC - Abnormal; Notable for the following components:   APPearance CLOUDY (*)    Leukocytes, UA MODERATE (*)    All other components within normal limits  URINALYSIS, MICROSCOPIC (REFLEX) - Abnormal; Notable for the following components:   Bacteria, UA MANY (*)    Squamous Epithelial / LPF 6-30 (*)    All other components within normal limits  PROTIME-INR  ABO/RH    EKG  EKG Interpretation None       Radiology No results found.  Procedures Procedures (including critical care time)  Medications Ordered in ED Medications - No data to display   Initial Impression / Assessment and Plan / ED Course  I have reviewed the triage vital signs and the nursing notes.  Pertinent labs & imaging results that were available during my care of the patient were reviewed by me and considered in my medical decision making (see chart for details).    Placed medially on monitors. Reassuring heart rates 2. No contractions. I discussed the case with Dr. Carlyon Prowsoran the patient's obstetrician. [facility] if patient is indeed in preterm labor. Is quite stable here in appropriate for discharge without current signs of labor. However with her history of preterm  labor feel that she should be reevaluated there. I did not perform speculum exam here and she will be especially transported.  No current signs of active premature labor.  Final Clinical Impressions(s) / ED Diagnoses   Final diagnoses:  Generalized abdominal pain  [redacted] weeks gestation of pregnancy    ED Discharge Orders    None       Rolland PorterJames, Johncarlo Maalouf, MD 02/14/17 [phone]

## 2017-02-14 NOTE — Progress Notes (Addendum)
G2P1 @ 29.[redacted] wksga twin IUP. Presents to triage for r/o srom. States had stringly mucus discharge. Denies bleeding. +FM noted. EFM applied.  Provider at bs assessing. Speculum exam done. No pooling. Fern done.   2003: SVE posterior closed long and balottable  2020: terb administered.   2100: D/c orders received.   2101: D/c instructions given with pt understanding. Pt left unit via ambulatory with family.

## 2017-02-14 NOTE — Progress Notes (Signed)
1659  Received call regarding this 21 yo G2P1 @ 29.[redacted] wks GA in with complaint of "lost mucous plug" this afternoon.  Pt has a history of PTD @ 27 wks.  [facility] Dr[facility] Shawn[facility] Ponsorn.  1706 Dr. Alvester MorinNewton notified of pt in ED and of above.  She recommends transfer of pt to MAU for R/O ROM and PTL. 1711 EDP, Dr. Fayrene FearingJames notified that Dr. Alvester MorinNewton would accept transfer of this pt.  He sts that he has placed a call to pt's OB and is awaiting call back.  I advised that we would continue to remotely monitor EFM until which time pt is transferred to [facility] or MAU if that becomes necessary. [phone] EDP continues to await call back from pt's OB MD.  I requested that ED RNs adjust EFM to trace better.

## 2017-02-15 LAB — ABO/RH: ABO/RH(D): B POS

## 2017-02-22 ENCOUNTER — Ambulatory Visit (HOSPITAL_COMMUNITY)
Admission: RE | Admit: 2017-02-22 | Discharge: 2017-02-22 | Disposition: A | Discharge status: Home / Self Care | Payer: Medicaid Other | Source: Ambulatory Visit | Attending: Specialist | Admitting: Specialist

## 2017-02-22 ENCOUNTER — Encounter (HOSPITAL_COMMUNITY): Payer: Self-pay

## 2017-02-22 ENCOUNTER — Other Ambulatory Visit (HOSPITAL_COMMUNITY): Payer: Self-pay | Admitting: Maternal and Fetal Medicine

## 2017-02-22 DIAGNOSIS — O09213 Supervision of pregnancy with history of pre-term labor, third trimester: Secondary | ICD-10-CM | POA: Diagnosis not present

## 2017-02-22 DIAGNOSIS — Z3A30 30 weeks gestation of pregnancy: Secondary | ICD-10-CM | POA: Diagnosis not present

## 2017-02-22 DIAGNOSIS — O30039 Twin pregnancy, monochorionic/diamniotic, unspecified trimester: Secondary | ICD-10-CM

## 2017-02-22 DIAGNOSIS — O30033 Twin pregnancy, monochorionic/diamniotic, third trimester: Secondary | ICD-10-CM | POA: Insufficient documentation

## 2017-02-22 NOTE — Addendum Note (Signed)
Encounter addended by: Marcellina MillinMoser, Aliz Meritt L, RTR on: 02/22/2017 12:56 PM  Actions taken: Imaging Exam ended

## 2017-03-08 ENCOUNTER — Ambulatory Visit (HOSPITAL_COMMUNITY)
Admission: RE | Admit: 2017-03-08 | Discharge: 2017-03-08 | Disposition: A | Discharge status: Home / Self Care | Payer: Medicaid Other | Source: Ambulatory Visit | Attending: Specialist | Admitting: Specialist

## 2017-03-08 ENCOUNTER — Encounter (HOSPITAL_COMMUNITY): Payer: Self-pay

## 2017-03-08 ENCOUNTER — Other Ambulatory Visit (HOSPITAL_COMMUNITY): Payer: Self-pay | Admitting: Maternal and Fetal Medicine

## 2017-03-08 DIAGNOSIS — O30039 Twin pregnancy, monochorionic/diamniotic, unspecified trimester: Secondary | ICD-10-CM

## 2017-03-08 DIAGNOSIS — O09219 Supervision of pregnancy with history of pre-term labor, unspecified trimester: Secondary | ICD-10-CM | POA: Diagnosis not present

## 2017-03-08 DIAGNOSIS — Z3A32 32 weeks gestation of pregnancy: Secondary | ICD-10-CM | POA: Insufficient documentation

## 2017-03-22 ENCOUNTER — Encounter (HOSPITAL_COMMUNITY): Payer: Self-pay

## 2017-03-22 ENCOUNTER — Other Ambulatory Visit (HOSPITAL_COMMUNITY): Payer: Self-pay | Admitting: *Deleted

## 2017-03-22 ENCOUNTER — Other Ambulatory Visit (HOSPITAL_COMMUNITY): Payer: Self-pay | Admitting: Maternal and Fetal Medicine

## 2017-03-22 ENCOUNTER — Ambulatory Visit (HOSPITAL_COMMUNITY)
Admission: RE | Admit: 2017-03-22 | Discharge: 2017-03-22 | Disposition: A | Discharge status: Home / Self Care | Payer: Medicaid Other | Source: Ambulatory Visit | Attending: Specialist | Admitting: Specialist

## 2017-03-22 DIAGNOSIS — Z3A32 32 weeks gestation of pregnancy: Secondary | ICD-10-CM

## 2017-03-22 DIAGNOSIS — O09213 Supervision of pregnancy with history of pre-term labor, third trimester: Secondary | ICD-10-CM

## 2017-03-22 DIAGNOSIS — O30039 Twin pregnancy, monochorionic/diamniotic, unspecified trimester: Secondary | ICD-10-CM

## 2017-03-22 DIAGNOSIS — O09893 Supervision of other high risk pregnancies, third trimester: Secondary | ICD-10-CM | POA: Diagnosis not present

## 2017-03-22 DIAGNOSIS — Z3A34 34 weeks gestation of pregnancy: Secondary | ICD-10-CM

## 2017-03-29 ENCOUNTER — Ambulatory Visit (HOSPITAL_COMMUNITY)
Admission: RE | Admit: 2017-03-29 | Discharge: 2017-03-29 | Disposition: A | Discharge status: Home / Self Care | Payer: Medicaid Other | Source: Ambulatory Visit | Attending: Specialist | Admitting: Specialist

## 2017-03-29 ENCOUNTER — Encounter (HOSPITAL_COMMUNITY): Payer: Self-pay

## 2017-07-20 ENCOUNTER — Encounter (HOSPITAL_COMMUNITY): Payer: Self-pay

## 2019-12-31 ENCOUNTER — Other Ambulatory Visit: Payer: Self-pay

## 2019-12-31 ENCOUNTER — Encounter (HOSPITAL_BASED_OUTPATIENT_CLINIC_OR_DEPARTMENT_OTHER): Payer: Self-pay | Admitting: Emergency Medicine

## 2019-12-31 ENCOUNTER — Emergency Department (HOSPITAL_BASED_OUTPATIENT_CLINIC_OR_DEPARTMENT_OTHER)
Admission: EM | Admit: 2019-12-31 | Discharge: 2019-12-31 | Disposition: A | Discharge status: Home / Self Care | Payer: Medicaid Other | Attending: Emergency Medicine | Admitting: Emergency Medicine

## 2019-12-31 DIAGNOSIS — R439 Unspecified disturbances of smell and taste: Secondary | ICD-10-CM | POA: Insufficient documentation

## 2019-12-31 DIAGNOSIS — E860 Dehydration: Secondary | ICD-10-CM

## 2019-12-31 DIAGNOSIS — Z20822 Contact with and (suspected) exposure to covid-19: Secondary | ICD-10-CM | POA: Diagnosis not present

## 2019-12-31 DIAGNOSIS — R059 Cough, unspecified: Secondary | ICD-10-CM | POA: Insufficient documentation

## 2019-12-31 DIAGNOSIS — R112 Nausea with vomiting, unspecified: Secondary | ICD-10-CM | POA: Diagnosis not present

## 2019-12-31 DIAGNOSIS — R110 Nausea: Secondary | ICD-10-CM

## 2019-12-31 DIAGNOSIS — R0981 Nasal congestion: Secondary | ICD-10-CM | POA: Diagnosis not present

## 2019-12-31 DIAGNOSIS — R519 Headache, unspecified: Secondary | ICD-10-CM | POA: Diagnosis present

## 2019-12-31 LAB — CBC WITH DIFFERENTIAL/PLATELET
Abs Immature Granulocytes: 0.01 10*3/uL (ref 0.00–0.07)
Basophils Absolute: 0 10*3/uL (ref 0.0–0.1)
Basophils Relative: 0 %
Eosinophils Absolute: 0 10*3/uL (ref 0.0–0.5)
Eosinophils Relative: 0 %
HCT: 41.2 % (ref 36.0–46.0)
Hemoglobin: 13.9 g/dL (ref 12.0–15.0)
Immature Granulocytes: 0 %
Lymphocytes Relative: 36 %
Lymphs Abs: 1.4 10*3/uL (ref 0.7–4.0)
MCH: 30.1 pg (ref 26.0–34.0)
MCHC: 33.7 g/dL (ref 30.0–36.0)
MCV: 89.2 fL (ref 80.0–100.0)
Monocytes Absolute: 0.4 10*3/uL (ref 0.1–1.0)
Monocytes Relative: 12 %
Neutro Abs: 2 10*3/uL (ref 1.7–7.7)
Neutrophils Relative %: 52 %
Platelets: 202 10*3/uL (ref 150–400)
RBC: 4.62 MIL/uL (ref 3.87–5.11)
RDW: 12.1 % (ref 11.5–15.5)
WBC Morphology: ABNORMAL
WBC: 3.8 10*3/uL — ABNORMAL LOW (ref 4.0–10.5)
nRBC: 0 % (ref 0.0–0.2)

## 2019-12-31 LAB — URINALYSIS, ROUTINE W REFLEX MICROSCOPIC
Glucose, UA: NEGATIVE mg/dL
Ketones, ur: >80 mg/dL — AB
Leukocytes,Ua: NEGATIVE
Nitrite: NEGATIVE
Protein, ur: 30 mg/dL — AB
Specific Gravity, Urine: 1.025 (ref 1.005–1.030)
pH: 6 (ref 5.0–8.0)

## 2019-12-31 LAB — URINALYSIS, MICROSCOPIC (REFLEX)

## 2019-12-31 LAB — COMPREHENSIVE METABOLIC PANEL
ALT: 35 U/L (ref 0–44)
AST: 31 U/L (ref 15–41)
Albumin: 4.4 g/dL (ref 3.5–5.0)
Alkaline Phosphatase: 22 U/L — ABNORMAL LOW (ref 38–126)
Anion gap: 12 (ref 5–15)
BUN: 10 mg/dL (ref 6–20)
CO2: 26 mmol/L (ref 22–32)
Calcium: 9 mg/dL (ref 8.9–10.3)
Chloride: 100 mmol/L (ref 98–111)
Creatinine, Ser: 0.77 mg/dL (ref 0.44–1.00)
GFR, Estimated: >60 mL/min (ref >60)
Glucose, Bld: 91 mg/dL (ref 70–99)
Potassium: 3.5 mmol/L (ref 3.5–5.1)
Sodium: 138 mmol/L (ref 135–145)
Total Bilirubin: 1 mg/dL (ref 0.3–1.2)
Total Protein: 8.7 g/dL — ABNORMAL HIGH (ref 6.5–8.1)

## 2019-12-31 LAB — LIPASE, BLOOD: Lipase: 26 U/L (ref 11–51)

## 2019-12-31 LAB — PREGNANCY, URINE: Preg Test, Ur: POSITIVE — AB

## 2019-12-31 MED ORDER — SODIUM CHLORIDE 0.9 % IV SOLN: Freq: Once | INTRAVENOUS | Status: AC

## 2019-12-31 MED ORDER — ONDANSETRON 4 MG PO TBDP: 4.0000 mg | ORAL_TABLET | Freq: Three times a day (TID) | ORAL | 0 refills | Status: AC | PRN

## 2019-12-31 MED ORDER — ONDANSETRON HCL 4 MG/2ML IJ SOLN
4.0000 mg | Freq: Once | INTRAMUSCULAR | Status: AC
  Administered 2019-12-31: 4 mg via INTRAVENOUS
  Filled 2019-12-31: qty 2

## 2019-12-31 NOTE — ED Provider Notes (Signed)
[facility] EMERGENCY DEPARTMENT Provider Note   CSN: [id number] Arrival date & time: 12/31/19  6283     History Chief Complaint  Patient presents with  . Covid Exposure    Gabrielle Lopez is a 24 y.o. female.  HPI Patient is a 24 year old female with past medical history of herpes labialis  Patient is presented today for 7 days of headache which is circumferential achy constant although seems to be waxing and waning and does not bother her at night or in the mornings.  She states over the past week she has had cough and some congestion.  She states that she has also had some burning in her nose.  She also has had loss of taste and smell.  She states she has been exposed to COVID-19.  She states that she is not vaccinated.  She has no other associated symptoms.  No aggravating or mitigating factors.  She has tried no medications prior to arrival.  She has had no nausea medicines.  She feels dehydrated and is unable to keep down her fluids or food.  Patient was tested for COVID-19 yesterday.  Test is pending.    Past Medical History:  Diagnosis Date  . Herpes   . Medical history non-contributory     There are no problems to display for this patient.   Past Surgical History:  Procedure Laterality Date  . NO PAST SURGERIES       OB History    Gravida  2   Para  1   Term      Preterm  1   AB      Living  1     SAB      TAB      Ectopic      Multiple      Live Births              Family History  Family history unknown: Yes    Social History   Tobacco Use  . Smoking status: Never Smoker  . Smokeless tobacco: Never Used  Substance Use Topics  . Alcohol use: No  . Drug use: No    Home Medications Prior to Admission medications   Medication Sig Start Date End Date Taking? Authorizing Provider  Doxylamine-Pyridoxine (DICLEGIS PO) Take by mouth.    [provider]  IRON PO Take by mouth.    [provider]    ondansetron (ZOFRAN ODT) 4 MG disintegrating tablet Take 1 tablet (4 mg total) by mouth every 8 (eight) hours as needed for nausea or vomiting. 12/31/19   Gailen Shelter, PA  Prenatal Vit-Fe Fumarate-FA (PRENATAL COMPLETE) 14-0.4 MG TABS Take 1 tablet by mouth every morning. 11/24/14   Renne Crigler, PA-C  diphenhydrAMINE (BENADRYL) 25 MG tablet Take 1 tablet (25 mg total) by mouth every 6 (six) hours. 02/22/14 11/24/14  Tilden Fossa, MD  famotidine (PEPCID) 20 MG tablet Take 1 tablet (20 mg total) by mouth 2 (two) times daily. 02/22/14 11/24/14  Tilden Fossa, MD    Allergies    Shellfish allergy  Review of Systems   Review of Systems  Constitutional: Positive for fatigue. Negative for fever.  HENT: Positive for congestion, postnasal drip and rhinorrhea.   Respiratory: Positive for cough. Negative for shortness of breath.   Cardiovascular: Negative for chest pain.  Gastrointestinal: Positive for diarrhea, nausea and vomiting. Negative for abdominal distention, abdominal pain and constipation.  Musculoskeletal: Positive for myalgias.  Neurological: Positive for headaches. Negative for  dizziness.    Physical Exam Updated Vital Signs BP 135/90 (BP Location: Left Arm)   Pulse 80   Temp 98.5 F (36.9 C) (Oral)   Resp 18   Ht 5\' 6"  (1.676 m)   Wt 77.1 kg   SpO2 100%   BMI 27.44 kg/m   Physical Exam Vitals and nursing note reviewed.  Constitutional:      General: She is not in acute distress.    Comments: Pleasant well-appearing 24 year old.  In no acute distress.  Sitting comfortably in bed.  Able answer questions appropriately follow commands. No increased work of breathing. Speaking in full sentences.   HENT:     Head: Normocephalic and atraumatic.     Nose: Nose normal.     Mouth/Throat:     Mouth: Mucous membranes are dry.  Eyes:     General: No scleral icterus. Cardiovascular:     Rate and Rhythm: Normal rate and regular rhythm.     Pulses: Normal pulses.      Heart sounds: Normal heart sounds.  Pulmonary:     Effort: Pulmonary effort is normal. No respiratory distress.     Breath sounds: Normal breath sounds. No wheezing.  Abdominal:     Palpations: Abdomen is soft.     Tenderness: There is no abdominal tenderness. There is no guarding or rebound.     Comments: No tenderness palpation of the abdomen.  No guarding or rebound.  No bruising.  Musculoskeletal:     Cervical back: Normal range of motion.     Right lower leg: No edema.     Left lower leg: No edema.  Skin:    General: Skin is warm and dry.     Capillary Refill: Capillary refill takes less than 2 seconds.  Neurological:     General: No focal deficit present.     Mental Status: She is alert and oriented to person, place, and time. Mental status is at baseline.     Cranial Nerves: No cranial nerve deficit.     Motor: No weakness.  Psychiatric:        Mood and Affect: Mood normal.        Behavior: Behavior normal.     ED Results / Procedures / Treatments   Labs (all labs ordered are listed, but only abnormal results are displayed) Labs Reviewed  CBC WITH DIFFERENTIAL/PLATELET - Abnormal; Notable for the following components:      Result Value   WBC 3.8 (*)    All other components within normal limits  COMPREHENSIVE METABOLIC PANEL - Abnormal; Notable for the following components:   Total Protein 8.7 (*)    Alkaline Phosphatase 22 (*)    All other components within normal limits  URINALYSIS, ROUTINE W REFLEX MICROSCOPIC - Abnormal; Notable for the following components:   Color, Urine AMBER (*)    Hgb urine dipstick SMALL (*)    Bilirubin Urine SMALL (*)    Ketones, ur >80 (*)    Protein, ur 30 (*)    All other components within normal limits  PREGNANCY, URINE - Abnormal; Notable for the following components:   Preg Test, Ur POSITIVE (*)    All other components within normal limits  URINALYSIS, MICROSCOPIC (REFLEX) - Abnormal; Notable for the following components:    Bacteria, UA FEW (*)    All other components within normal limits  LIPASE, BLOOD    EKG None  Radiology No results found.  Procedures Procedures (including critical care time)  Medications  Ordered in ED Medications  0.9 %  sodium chloride infusion ( Intravenous Stopped 12/31/19 1228)  ondansetron (ZOFRAN) injection 4 mg (4 mg Intravenous Given 12/31/19 1118)    ED Course  I have reviewed the triage vital signs and the nursing notes.  Pertinent labs & imaging results that were available during my care of the patient were reviewed by me and considered in my medical decision making (see chart for details).  Patient is 24 year old female with past medical history detailed above presented today with numerous symptoms symptoms consistent with COVID-19.  She has had some nausea and vomiting.  Her physical exam is notable for no abdominal tenderness.  She has no neurologic abnormalities on her exam she is overall well-appearing.  Not tachycardic but does appear somewhat dehydrated.  Will provide patient with IV fluids Zofran and reassess.  Clinical Course as of Dec 31 1247  Wed Dec 31, 2019  1238 No significant electrolyte abnormalities.  Comprehensive metabolic panel(!) [WF]  1239 CBC with mild neutropenia likely secondary to presumed Covid.   [WF]  1239 Urinalysis with small hemoglobin ketones present and protein.  This may be secondary to dehydration also patient has had some mild spotting as well as had a abortion 4-6 weeks ago.  She is uncertain as to the exact date.   [WF]  1239 Lipase within normal limits doubt pancreatitis   [WF]  1240 Urine pregnancy test is positive.  I discussed this with my attending physician Dr. Myrtis Ser our plan is to have patient follow-up with her OB/GYN this week.   [WF]    Clinical Course User Index [WF] Gailen Shelter, [place]   Patient does have positive hCG this may be residual from her recent abortion.  She has no abdominal pain no concern for  ectopic pregnancy at this time given her symptoms.  She will follow up with her OB/GYN this week.  She is agreeable to plan.  She is tolerating p.o. at this time.  Ambulatory and states that she feels much improved.  Headache is resolved and blood pressure is within normal limits time.  MDM Rules/Calculators/A&P                          Final Clinical Impression(s) / ED Diagnoses Final diagnoses:  Nausea  Acute nonintractable headache, unspecified headache type  Dehydration  Non-intractable vomiting with nausea, unspecified vomiting type  Contact with and (suspected) exposure to covid-19    Rx / DC Orders ED Discharge Orders         Ordered    ondansetron (ZOFRAN ODT) 4 MG disintegrating tablet  Every 8 hours PRN        12/31/19 1241           Gailen Shelter, [place] 12/31/19 1249    Sabino Donovan, MD 12/31/19 1515

## 2019-12-31 NOTE — ED Triage Notes (Signed)
Reports being exposed to Covid on the 10th.  Went yesterday for a test but has not gotten results yet.  C/o n/v/d with headache, loss of taste and smell.  Runny nose with burning in nose and mild cough.

## 2019-12-31 NOTE — ED Notes (Signed)
Pt states feeling much improved.

## 2019-12-31 NOTE — Discharge Instructions (Addendum)
Please follow-up with Dr. Shawnie Pons this week.  Please call to make an appointment.  Your pregnancy test was positive which may be residual from the abortion however this needs to be followed up on by your primary care doctor.  Please return to the ER for any new or concerning symptoms. Your lab work was otherwise unremarkable.  Please use Zofran as needed for nausea.  You may take 1000 g of Tylenol every 6 hours for headache.  Drink plenty of water.

## 2020-01-14 ENCOUNTER — Other Ambulatory Visit: Payer: Self-pay

## 2020-01-14 ENCOUNTER — Emergency Department (HOSPITAL_BASED_OUTPATIENT_CLINIC_OR_DEPARTMENT_OTHER)
Admission: EM | Admit: 2020-01-14 | Discharge: 2020-01-14 | Disposition: A | Discharge status: Home / Self Care | Payer: Medicaid Other | Attending: Emergency Medicine | Admitting: Emergency Medicine

## 2020-01-14 ENCOUNTER — Encounter (HOSPITAL_BASED_OUTPATIENT_CLINIC_OR_DEPARTMENT_OTHER): Payer: Self-pay

## 2020-01-14 DIAGNOSIS — K0889 Other specified disorders of teeth and supporting structures: Secondary | ICD-10-CM | POA: Insufficient documentation

## 2020-01-14 HISTORY — DX: Complete or unspecified spontaneous abortion without complication: O03.9

## 2020-01-14 HISTORY — DX: Reserved for inherently not codable concepts without codable children: IMO0001

## 2020-01-14 MED ORDER — AMOXICILLIN 500 MG PO CAPS: 500.0000 mg | ORAL_CAPSULE | Freq: Three times a day (TID) | ORAL | 0 refills | Status: AC

## 2020-01-14 NOTE — ED Triage Notes (Signed)
Pt /co left lower toothache x 3 days-NAD-steady gait

## 2020-01-14 NOTE — ED Provider Notes (Signed)
[facility] EMERGENCY DEPARTMENT Provider Note   CSN: [id number] Arrival date & time: 01/14/20  2033     History Chief Complaint  Patient presents with  . Dental Pain    Gabrielle Lopez is a 24 y.o. female who presents to the ED today with complaint of gradual onset, constant, achy/sharp, left lower dental pain x 3 days. Pt reports that she attempted to get in to see her dentist however they could not see her for 2 weeks. She has not been taking anything for the pain as she does not have anything at home to take. Pt denies any trauma to the teeth causing pain. She denies fevers, chills, facial swelling, trismus, foul taste in mouth, or any other associated symptoms.   Per chart review: Pt was seen in the ED on 10/20 for COVID like symptoms. Pregnancy test returned POSITIVE at this time however pt with recent D&C a few days before hand.   The history is provided by the patient and medical records.       Past Medical History:  Diagnosis Date  . Abortion   . Herpes   . Medical history non-contributory     There are no problems to display for this patient.   Past Surgical History:  Procedure Laterality Date  . NO PAST SURGERIES       OB History    Gravida  2   Para  1   Term      Preterm  1   AB      Living  1     SAB      TAB      Ectopic      Multiple      Live Births              Family History  Family history unknown: Yes    Social History   Tobacco Use  . Smoking status: Never Smoker  . Smokeless tobacco: Never Used  Substance Use Topics  . Alcohol use: No  . Drug use: No    Home Medications Prior to Admission medications   Medication Sig Start Date End Date Taking? Authorizing Provider  amoxicillin (AMOXIL) 500 MG capsule Take 1 capsule (500 mg total) by mouth 3 (three) times daily. 01/14/20   Daveigh Batty, PA-C  Doxylamine-Pyridoxine (DICLEGIS PO) Take by mouth.    [provider]  IRON PO Take by mouth.     [provider]  ondansetron (ZOFRAN ODT) 4 MG disintegrating tablet Take 1 tablet (4 mg total) by mouth every 8 (eight) hours as needed for nausea or vomiting. 12/31/19   Gailen Shelter, PA  Prenatal Vit-Fe Fumarate-FA (PRENATAL COMPLETE) 14-0.4 MG TABS Take 1 tablet by mouth every morning. 11/24/14   Renne Crigler, PA-C  diphenhydrAMINE (BENADRYL) 25 MG tablet Take 1 tablet (25 mg total) by mouth every 6 (six) hours. 02/22/14 11/24/14  Tilden Fossa, MD  famotidine (PEPCID) 20 MG tablet Take 1 tablet (20 mg total) by mouth 2 (two) times daily. 02/22/14 11/24/14  Tilden Fossa, MD    Allergies    Shellfish allergy  Review of Systems   Review of Systems  Constitutional: Negative for chills and fever.  HENT: Positive for dental problem. Negative for facial swelling, sore throat and trouble swallowing.     Physical Exam Updated Vital Signs BP (!) 145/90 (BP Location: Left Arm)   Pulse 89   Temp 98.1 F (36.7 C) (Oral)   Resp 18   Ht  5\' 6"  (1.676 m)   Wt 72.6 kg   SpO2 98%   BMI 25.82 kg/m   Physical Exam Vitals and nursing note reviewed.  Constitutional:      Appearance: She is not ill-appearing.  HENT:     Head: Normocephalic and atraumatic.     Mouth/Throat:      Comments: Nose clear.  L lower tooth #18,20 decayedwith caries with TTP, with minimal surrounding gingival swelling and erythema, no definite abscess, no evidence of ludwig's.  Oropharynx clear and moist, without uvular swelling or deviation, no trismus or drooling, no tonsillar swelling or erythema, no exudates.   Eyes:     Conjunctiva/sclera: Conjunctivae normal.  Cardiovascular:     Rate and Rhythm: Normal rate and regular rhythm.     Pulses: Normal pulses.     Heart sounds: No murmur heard.   Pulmonary:     Effort: Pulmonary effort is normal.     Breath sounds: Normal breath sounds. No wheezing, rhonchi or rales.  Skin:    General: Skin is warm and dry.     Coloration: Skin is not  jaundiced.  Neurological:     Mental Status: She is alert.     ED Results / Procedures / Treatments   Labs (all labs ordered are listed, but only abnormal results are displayed) Labs Reviewed - No data to display  EKG None  Radiology No results found.  Procedures Procedures (including critical care time)  Medications Ordered in ED Medications - No data to display  ED Course  I have reviewed the triage vital signs and the nursing notes.  Pertinent labs & imaging results that were available during my care of the patient were reviewed by me and considered in my medical decision making (see chart for details).    MDM Rules/Calculators/A&P                          24 year old female presents to the ED today with left lower dental pain for the past 3 to 4 days.  Unable to see her dentist for the next 2 weeks.  Has not been taking anything for pain.  On arrival to the ED patient is afebrile, nontachycardic nontachypneic.  She does appear mildly uncomfortable on exam.  She has dental caries to tooth #18 and 20 on the left lower side.  Tooth #19 is missing with previous dental extraction.  She has no signs of Ludwig's angina at this time or definitive dental abscess that needs to be drained.  Provide antibiotics at this time and additional dental resources so patient can see if she can see someone sooner than 2 weeks.  She has not been taking anything for symptoms, states that ibuprofen does not react well with her, have advised Tylenol for pain.  It does appear that patient was seen in the ED on 10/20 for Covid-like symptoms, her beta hCG was positive at that time however she did have a recent D&C.  She denies risk of pregnancy today. Pt in agreement with plan and stable for discharge home.   This note was prepared using Dragon voice recognition software and may include unintentional dictation errors due to the inherent limitations of voice recognition software.  Final Clinical  Impression(s) / ED Diagnoses Final diagnoses:  Pain, dental    Rx / DC Orders ED Discharge Orders         Ordered    amoxicillin (AMOXIL) 500 MG capsule  3 times  daily        01/14/20 2104           Discharge Instructions     Please pick up antibiotics and take as prescribed. I would recommend Tylenol as needed for pain.  Attached is a list of dental resources in the area. Follow up with one of them or your own dentist as soon as possible.  Return to the ED IMMEDIATELY for any worsening symptoms including worsening pain, facial swelling, inability to open jaw all the way, drainage from tooth/foul taste in mouth, fevers > 100.4, or any other new/concerning symptoms        Tanda Rockers, PA-C 01/14/20 2106    Gwyneth Sprout, MD 01/14/20 2235

## 2020-01-14 NOTE — Discharge Instructions (Signed)
Please pick up antibiotics and take as prescribed. I would recommend Tylenol as needed for pain.  Attached is a list of dental resources in the area. Follow up with one of them or your own dentist as soon as possible.  Return to the ED IMMEDIATELY for any worsening symptoms including worsening pain, facial swelling, inability to open jaw all the way, drainage from tooth/foul taste in mouth, fevers > 100.4, or any other new/concerning symptoms

## 2021-02-17 ENCOUNTER — Other Ambulatory Visit: Payer: Self-pay

## 2021-02-17 ENCOUNTER — Emergency Department (HOSPITAL_BASED_OUTPATIENT_CLINIC_OR_DEPARTMENT_OTHER)
Admission: EM | Admit: 2021-02-17 | Discharge: 2021-02-17 | Disposition: A | Discharge status: Home / Self Care | Payer: Medicaid Other | Attending: Student | Admitting: Student

## 2021-02-17 ENCOUNTER — Encounter (HOSPITAL_BASED_OUTPATIENT_CLINIC_OR_DEPARTMENT_OTHER): Payer: Self-pay

## 2021-02-17 ENCOUNTER — Emergency Department (HOSPITAL_BASED_OUTPATIENT_CLINIC_OR_DEPARTMENT_OTHER): Payer: Medicaid Other

## 2021-02-17 DIAGNOSIS — Z23 Encounter for immunization: Secondary | ICD-10-CM | POA: Diagnosis not present

## 2021-02-17 DIAGNOSIS — S81012A Laceration without foreign body, left knee, initial encounter: Secondary | ICD-10-CM | POA: Insufficient documentation

## 2021-02-17 DIAGNOSIS — W25XXXA Contact with sharp glass, initial encounter: Secondary | ICD-10-CM | POA: Insufficient documentation

## 2021-02-17 DIAGNOSIS — M79675 Pain in left toe(s): Secondary | ICD-10-CM | POA: Insufficient documentation

## 2021-02-17 DIAGNOSIS — S8992XA Unspecified injury of left lower leg, initial encounter: Secondary | ICD-10-CM | POA: Diagnosis present

## 2021-02-17 MED ORDER — LIDOCAINE-EPINEPHRINE (PF) 2 %-1:200000 IJ SOLN
10.0000 mL | Freq: Once | INTRAMUSCULAR | Status: AC
  Administered 2021-02-17: 10 mL via INTRADERMAL
  Filled 2021-02-17: qty 20

## 2021-02-17 MED ORDER — BACITRACIN ZINC 500 UNIT/GM EX OINT
TOPICAL_OINTMENT | Freq: Two times a day (BID) | CUTANEOUS | Status: DC
  Administered 2021-02-17: 1 via TOPICAL
  Filled 2021-02-17: qty 28.35

## 2021-02-17 MED ORDER — TETANUS-DIPHTH-ACELL PERTUSSIS 5-2.5-18.5 LF-MCG/0.5 IM SUSY
0.5000 mL | PREFILLED_SYRINGE | Freq: Once | INTRAMUSCULAR | Status: AC
  Administered 2021-02-17: 0.5 mL via INTRAMUSCULAR
  Filled 2021-02-17: qty 0.5

## 2021-02-17 MED ORDER — VALACYCLOVIR HCL 1 G PO TABS: 1000.0000 mg | ORAL_TABLET | Freq: Every day | ORAL | 0 refills | Status: AC

## 2021-02-17 NOTE — ED Provider Notes (Signed)
[facility] EMERGENCY DEPARTMENT Provider Note   CSN: [id number] Arrival date & time: 02/17/21  7209     History Chief Complaint  Patient presents with   Laceration   Leg Pain    Gabrielle Lopez is a 25 y.o. female who presents with concern for laceration to the left anterior knee.  States that she was sitting in a chair last night when a large mirror fell off of a nearby table striking her right on top of the knee.  There was no shattering of the glass but she states that the impact of the knee split the skin open over her knee.  She was hoping that it would to bleeding at home overnight but continued to ooze throughout the night and gapes when she walks therefore she presents for repair today.  Additional endorses pain in the left great toe after kicking something accidentally a week ago.  Denies any numbness, tingling or weakness in the leg or any history of anticoagulation.  She is not up-to-date on her tetanus.    HPI     Past Medical History:  Diagnosis Date   Abortion    Herpes    Medical history non-contributory     There are no problems to display for this patient.   Past Surgical History:  Procedure Laterality Date   NO PAST SURGERIES       OB History     Gravida  2   Para  1   Term      Preterm  1   AB      Living  1      SAB      IAB      Ectopic      Multiple      Live Births              Family History  Family history unknown: Yes    Social History   Tobacco Use   Smoking status: Never   Smokeless tobacco: Never  Vaping Use   Vaping Use: Never used  Substance Use Topics   Alcohol use: Yes    Comment: rarely   Drug use: No    Home Medications Prior to Admission medications   Medication Sig Start Date End Date Taking? Authorizing Provider  amoxicillin (AMOXIL) 500 MG capsule Take 1 capsule (500 mg total) by mouth 3 (three) times daily. 01/14/20   Venter, Margaux, PA-C  Doxylamine-Pyridoxine (DICLEGIS PO)  Take by mouth.    [provider]  IRON PO Take by mouth.    [provider]  ondansetron (ZOFRAN ODT) 4 MG disintegrating tablet Take 1 tablet (4 mg total) by mouth every 8 (eight) hours as needed for nausea or vomiting. 12/31/19   Gailen Shelter, PA  Prenatal Vit-Fe Fumarate-FA (PRENATAL COMPLETE) 14-0.4 MG TABS Take 1 tablet by mouth every morning. 11/24/14   Renne Crigler, PA-C  diphenhydrAMINE (BENADRYL) 25 MG tablet Take 1 tablet (25 mg total) by mouth every 6 (six) hours. 02/22/14 11/24/14  Tilden Fossa, MD  famotidine (PEPCID) 20 MG tablet Take 1 tablet (20 mg total) by mouth 2 (two) times daily. 02/22/14 11/24/14  Tilden Fossa, MD    Allergies    Shellfish allergy  Review of Systems   Review of Systems  Constitutional: Negative.   HENT: Negative.    Respiratory: Negative.    Cardiovascular: Negative.   Gastrointestinal: Negative.   Genitourinary: Negative.   Musculoskeletal:  Positive for myalgias.  Skin:  Positive  for rash.  Neurological: Negative.    Physical Exam Updated Vital Signs BP (!) 134/98 (BP Location: Right Arm)   Pulse 100   Temp 98.4 F (36.9 C) (Oral)   Resp 18   Ht 5\' 7"  (1.702 m)   Wt 72.6 kg   LMP 01/18/2021 (Approximate)   SpO2 100%   BMI 25.06 kg/m   Physical Exam Vitals and nursing note reviewed.  HENT:     Head: Normocephalic and atraumatic.  Eyes:     General: No scleral icterus.       Right eye: No discharge.        Left eye: No discharge.     Conjunctiva/sclera: Conjunctivae normal.  Cardiovascular:     Pulses:          Dorsalis pedis pulses are 2+ on the left side.  Pulmonary:     Effort: Pulmonary effort is normal.  Musculoskeletal:       Legs:       Feet:  Skin:    General: Skin is warm and dry.     Capillary Refill: Capillary refill takes less than 2 seconds.  Neurological:     General: No focal deficit present.     Mental Status: She is alert and oriented to person, place, and time.     Gait:  Gait is intact.  Psychiatric:        Mood and Affect: Mood normal.    ED Results / Procedures / Treatments   Labs (all labs ordered are listed, but only abnormal results are displayed) Labs Reviewed - No data to display  EKG None  Radiology DG Knee Complete 4 Views Left  Result Date: 02/17/2021 CLINICAL DATA:  Glass mirror fell on left knee last night. EXAM: LEFT KNEE - COMPLETE 4+ VIEW COMPARISON:  None. FINDINGS: No evidence of fracture, dislocation, or joint effusion. No evidence of arthropathy or other focal bone abnormality. Soft tissues are unremarkable. No radiopaque foreign body identified. IMPRESSION: Negative. Electronically Signed   By: 14/10/2020 M.D.   On: 02/17/2021 10:53   DG Foot Complete Left  Result Date: 02/17/2021 CLINICAL DATA:  Trauma EXAM: LEFT FOOT - COMPLETE 3+ VIEW COMPARISON:  None. FINDINGS: There is no evidence of fracture or dislocation. There is no evidence of arthropathy or other focal bone abnormality. Soft tissues are unremarkable. IMPRESSION: No fracture or dislocation is seen in the left foot. Electronically Signed   By: 14/10/2020 M.D.   On: 02/17/2021 10:53    Procedures .14/08/2022Laceration Repair  Date/Time: 02/17/2021 1:54 PM Performed by: 14/10/2020, PA-C Authorized by: Paris Lore, PA-C   Consent:    Consent obtained:  Verbal   Consent given by:  Patient   Risks discussed:  Infection, need for additional repair, pain, poor cosmetic result and poor wound healing   Alternatives discussed:  No treatment and delayed treatment Universal protocol:    Procedure explained and questions answered to patient or proxy's satisfaction: yes     Relevant documents present and verified: yes     Test results available: yes     Imaging studies available: yes     Required blood products, implants, devices, and special equipment available: yes     Site/side marked: yes     Immediately prior to procedure, a time out was called:  yes     Patient identity confirmed:  Verbally with patient Anesthesia:    Anesthesia method:  Local infiltration   Local anesthetic:  Lidocaine 2%  WITH epi Laceration details:    Location:  Leg   Leg location:  L knee   Length (cm):  2 Pre-procedure details:    Preparation:  Patient was prepped and draped in usual sterile fashion Exploration:    Limited defect created (wound extended): no     Hemostasis achieved with:  Epinephrine   Imaging obtained: x-ray     Imaging outcome: foreign body not noted     Wound extent: no foreign bodies/material noted, no tendon damage noted and no underlying fracture noted     Contaminated: no   Treatment:    Area cleansed with:  Betadine and saline   Amount of cleaning:  Extensive   Irrigation solution:  Sterile saline Skin repair:    Repair method:  Sutures   Suture size:  4-0   Suture material:  Prolene   Suture technique:  Horizontal mattress   Number of sutures:  2 Approximation:    Approximation:  Close Repair type:    Repair type:  Simple Post-procedure details:    Dressing:  Antibiotic ointment and non-adherent dressing   Procedure completion:  Tolerated well, no immediate complications   Medications Ordered in ED Medications  bacitracin ointment (has no administration in time range)  lidocaine-EPINEPHrine (XYLOCAINE W/EPI) 2 %-1:200000 (PF) injection 10 mL (10 mLs Intradermal Given by Other 02/17/21 1259)  Tdap (BOOSTRIX) injection 0.5 mL (0.5 mLs Intramuscular Given 02/17/21 1258)    ED Course  I have reviewed the triage vital signs and the nursing notes.  Pertinent labs & imaging results that were available during my care of the patient were reviewed by me and considered in my medical decision making (see chart for details).    MDM Rules/Calculators/A&P                         25 year old female presents with concern for laceration of the left knee.  Vital signs are normal and intake.  2 cm laceration over the anterior  left knee as above.  It has been 16 hours since the injury, however as the wound is gaping and overlying a high tension area, do not feel will heal well without repair.  We will proceed with suture repair.  Boostrix ordered.   Wound repaired as above, patient tolerated procedure well.  No indication for prophylactic antibiotics as wound is quite superficial and very clean.  Leigha voiced understanding of her medical evaluation and treatment plan.  Of her questions answered to her expressed satisfaction.  Return precautions are given.  Patient is well-appearing, stable, and appropriate at this time.    This chart was dictated using voice recognition software, Dragon. Despite the best efforts of this provider to proofread and correct errors, errors may still occur which can change documentation meaning. Final Clinical Impression(s) / ED Diagnoses Final diagnoses:  None    Rx / DC Orders ED Discharge Orders     None        Sherrilee Gilles 02/17/21 1357    Kommor, Wyn Forster, MD 02/17/21 [phone]

## 2021-02-17 NOTE — Discharge Instructions (Signed)
You are seen in the ER today for your left knee injury.  This was repaired with 2 stitches which will need to be removed by medical provider in 7 to 10 days.  May continue to walk in the area.  He may shower as normal but do not submerge her knee underwater.  Please dress the wound daily with antibiotic ointment and return to the ER if you develop any redness, swelling, puslike drainage from the area, or any other new serious symptoms.

## 2021-02-17 NOTE — ED Triage Notes (Addendum)
States at 2100 last night a glass mirror fell onto left leg above knee, laceration to leg noted.   Also states hit great toe on left foot last week. C/o pain.

## 2021-02-26 ENCOUNTER — Other Ambulatory Visit: Payer: Self-pay

## 2021-02-26 ENCOUNTER — Encounter (HOSPITAL_BASED_OUTPATIENT_CLINIC_OR_DEPARTMENT_OTHER): Payer: Self-pay | Admitting: *Deleted

## 2021-02-26 ENCOUNTER — Emergency Department (HOSPITAL_BASED_OUTPATIENT_CLINIC_OR_DEPARTMENT_OTHER)
Admission: EM | Admit: 2021-02-26 | Discharge: 2021-02-26 | Disposition: A | Discharge status: Home / Self Care | Payer: Medicaid Other | Attending: Emergency Medicine | Admitting: Emergency Medicine

## 2021-02-26 DIAGNOSIS — Z760 Encounter for issue of repeat prescription: Secondary | ICD-10-CM | POA: Insufficient documentation

## 2021-02-26 DIAGNOSIS — Z4802 Encounter for removal of sutures: Secondary | ICD-10-CM | POA: Insufficient documentation

## 2021-02-26 MED ORDER — VALACYCLOVIR HCL 500 MG PO TABS: 500.0000 mg | ORAL_TABLET | Freq: Two times a day (BID) | ORAL | 0 refills | Status: AC

## 2021-02-26 NOTE — Discharge Instructions (Addendum)
You are seen in the emergency department today for suture removal.  I removed your two sutures and have refilled your Valtrex prescription.  Continue to monitor how you're doing and return to the ER for new or worsening symptoms such as increased redness, tenderness, drainage of pus, fevers or chills.   It has been a pleasure seeing and caring for you today and I hope you start feeling better soon!

## 2021-02-26 NOTE — ED Triage Notes (Signed)
Here for suture removal from left knee

## 2021-02-26 NOTE — ED Notes (Addendum)
D/c paperwork reviewed with pt, including prescription. No questions or concerns at time of d/c. Ambulatory to ED exit without assistance, steady gait.

## 2021-02-26 NOTE — ED Provider Notes (Signed)
[facility] EMERGENCY DEPARTMENT Provider Note   CSN: [id number] Arrival date & time: 02/26/21  1346     History Chief Complaint  Patient presents with   Suture / Staple Removal    Gabrielle Lopez is a 25 y.o. female who presents to the emergency department for suture removal.  Patient 2 sutures placed in a left knee laceration on 12/8. She denies fevers, chills, increased redness or tenderness, or purulent drainage.  She is also requesting refill of her Valtrex prescription for genital herpes.    Suture / Staple Removal      Past Medical History:  Diagnosis Date   Abortion    Herpes    Medical history non-contributory     There are no problems to display for this patient.   Past Surgical History:  Procedure Laterality Date   NO PAST SURGERIES       OB History     Gravida  2   Para  1   Term      Preterm  1   AB      Living  1      SAB      IAB      Ectopic      Multiple      Live Births              Family History  Family history unknown: Yes    Social History   Tobacco Use   Smoking status: Never   Smokeless tobacco: Never  Vaping Use   Vaping Use: Never used  Substance Use Topics   Alcohol use: Yes    Comment: rarely   Drug use: No    Home Medications Prior to Admission medications   Medication Sig Start Date End Date Taking? Authorizing Provider  valACYclovir (VALTREX) 500 MG tablet Take 1 tablet (500 mg total) by mouth 2 (two) times daily for 6 doses. 02/26/21 03/01/21 Yes Jemmie Ledgerwood T, PA-C  amoxicillin (AMOXIL) 500 MG capsule Take 1 capsule (500 mg total) by mouth 3 (three) times daily. 01/14/20   Venter, Margaux, PA-C  Doxylamine-Pyridoxine (DICLEGIS PO) Take by mouth.    [provider]  IRON PO Take by mouth.    [provider]  ondansetron (ZOFRAN ODT) 4 MG disintegrating tablet Take 1 tablet (4 mg total) by mouth every 8 (eight) hours as needed for nausea or vomiting. 12/31/19    Gailen Shelter, PA  Prenatal Vit-Fe Fumarate-FA (PRENATAL COMPLETE) 14-0.4 MG TABS Take 1 tablet by mouth every morning. 11/24/14   Renne Crigler, PA-C  diphenhydrAMINE (BENADRYL) 25 MG tablet Take 1 tablet (25 mg total) by mouth every 6 (six) hours. 02/22/14 11/24/14  Tilden Fossa, MD  famotidine (PEPCID) 20 MG tablet Take 1 tablet (20 mg total) by mouth 2 (two) times daily. 02/22/14 11/24/14  Tilden Fossa, MD    Allergies    Shellfish allergy  Review of Systems   Review of Systems  Constitutional:  Negative for chills and fever.  Skin:  Positive for wound. Negative for color change and rash.  All other systems reviewed and are negative.  Physical Exam Updated Vital Signs BP 120/84 (BP Location: Left Arm)    Pulse 84    Temp 98.8 F (37.1 C) (Oral)    Resp 16    Ht 5\' 7"  (1.702 m)    Wt 72.5 kg    LMP 01/27/2021 (Approximate)    SpO2 100%    Breastfeeding No    BMI  25.03 kg/m   Physical Exam Vitals and nursing note reviewed.  Constitutional:      Appearance: Normal appearance.  HENT:     Head: Normocephalic and atraumatic.  Eyes:     Conjunctiva/sclera: Conjunctivae normal.  Pulmonary:     Effort: Pulmonary effort is normal. No respiratory distress.  Skin:    General: Skin is warm and dry.     Comments: Approximately 1 cm partially healed laceration over left knee without overlying erythema, fluctuance, or drainage.   Neurological:     Mental Status: She is alert.  Psychiatric:        Mood and Affect: Mood normal.        Behavior: Behavior normal.    ED Results / Procedures / Treatments   Labs (all labs ordered are listed, but only abnormal results are displayed) Labs Reviewed - No data to display  EKG None  Radiology No results found.  Procedures .Suture Removal  Date/Time: 02/26/2021 2:29 PM Performed by: Su Monks, PA-C Authorized by: Su Monks, PA-C   Consent:    Consent obtained:  Verbal Location:    Location:  Lower  extremity   Lower extremity location:  Knee   Knee location:  L knee Procedure details:    Wound appearance:  No signs of infection   Number of sutures removed:  2 Post-procedure details:    Post-removal:  No dressing applied   Procedure completion:  Tolerated   Medications Ordered in ED Medications - No data to display  ED Course  I have reviewed the triage vital signs and the nursing notes.  Pertinent labs & imaging results that were available during my care of the patient were reviewed by me and considered in my medical decision making (see chart for details).    MDM Rules/Calculators/A&P                          Patient is 25 y/o female who presents to the emergency department for suture removal on left knee placed on 12/8. Removed 2 sutures on left knee. Patient tolerated procedure well. No evidence of infection.  Patient requested refill of Valtrex prescription for herpes breakouts. Refilled and patient discharged in stable condition. Answered all questions and gave return precautions.   Final Clinical Impression(s) / ED Diagnoses Final diagnoses:  Visit for suture removal  Medication refill    Rx / DC Orders ED Discharge Orders          Ordered    valACYclovir (VALTREX) 500 MG tablet  2 times daily        02/26/21 1419           Portions of this report may have been transcribed using voice recognition software. Every effort was made to ensure accuracy; however, inadvertent computerized transcription errors may be present.    Jeanella Flattery 02/26/21 1431    Terrilee Files, MD 02/26/21 [phone]

## 2021-03-08 ENCOUNTER — Other Ambulatory Visit: Payer: Self-pay

## 2021-03-08 ENCOUNTER — Encounter (HOSPITAL_BASED_OUTPATIENT_CLINIC_OR_DEPARTMENT_OTHER): Payer: Self-pay | Admitting: Emergency Medicine

## 2021-03-08 ENCOUNTER — Emergency Department (HOSPITAL_BASED_OUTPATIENT_CLINIC_OR_DEPARTMENT_OTHER)
Admission: EM | Admit: 2021-03-08 | Discharge: 2021-03-08 | Disposition: A | Discharge status: Home / Self Care | Payer: Medicaid Other | Attending: Student | Admitting: Student

## 2021-03-08 DIAGNOSIS — N898 Other specified noninflammatory disorders of vagina: Secondary | ICD-10-CM | POA: Diagnosis present

## 2021-03-08 DIAGNOSIS — B9689 Other specified bacterial agents as the cause of diseases classified elsewhere: Secondary | ICD-10-CM | POA: Diagnosis not present

## 2021-03-08 DIAGNOSIS — N760 Acute vaginitis: Secondary | ICD-10-CM | POA: Insufficient documentation

## 2021-03-08 LAB — WET PREP, GENITAL
Sperm: NONE SEEN
Trich, Wet Prep: NONE SEEN
WBC, Wet Prep HPF POC: >=10 — AB
Yeast Wet Prep HPF POC: NONE SEEN

## 2021-03-08 MED ORDER — VALACYCLOVIR HCL 1 G PO TABS: 1000.0000 mg | ORAL_TABLET | Freq: Three times a day (TID) | ORAL | 0 refills | Status: AC

## 2021-03-08 MED ORDER — METRONIDAZOLE 500 MG PO TABS: 500.0000 mg | ORAL_TABLET | Freq: Two times a day (BID) | ORAL | 0 refills | Status: DC

## 2021-03-08 NOTE — Discharge Instructions (Signed)
Follow-up with your gynecologist for further management.  Take Flagyl as prescribed and complete the full course.

## 2021-03-08 NOTE — ED Provider Notes (Signed)
[facility] EMERGENCY DEPARTMENT Provider Note   CSN: [id number] Arrival date & time: 03/08/21  1708     History Chief Complaint  Patient presents with   Vaginal Odor    Gabrielle Lopez is a 25 y.o. female.  25 year old female presents with complaint of vaginal odor for the past 2 days following her menstrual cycle reports prior BV and feels this is the same.  Denies concern for pregnancy today.  Is sexually active with her boyfriend.  Denies pelvic pain, fevers, concern for retained foreign body.  No other complaints or concerns today.      Past Medical History:  Diagnosis Date   Abortion    Herpes    Medical history non-contributory     There are no problems to display for this patient.   Past Surgical History:  Procedure Laterality Date   NO PAST SURGERIES       OB History     Gravida  2   Para  1   Term      Preterm  1   AB      Living  1      SAB      IAB      Ectopic      Multiple      Live Births              Family History  Family history unknown: Yes    Social History   Tobacco Use   Smoking status: Never   Smokeless tobacco: Never  Vaping Use   Vaping Use: Never used  Substance Use Topics   Alcohol use: Yes    Comment: rarely   Drug use: No    Home Medications Prior to Admission medications   Medication Sig Start Date End Date Taking? Authorizing Provider  metroNIDAZOLE (FLAGYL) 500 MG tablet Take 1 tablet (500 mg total) by mouth 2 (two) times daily. 03/08/21  Yes Jeannie Fend, PA-C  amoxicillin (AMOXIL) 500 MG capsule Take 1 capsule (500 mg total) by mouth 3 (three) times daily. 01/14/20   Venter, Margaux, PA-C  Doxylamine-Pyridoxine (DICLEGIS PO) Take by mouth.    [provider]  IRON PO Take by mouth.    [provider]  ondansetron (ZOFRAN ODT) 4 MG disintegrating tablet Take 1 tablet (4 mg total) by mouth every 8 (eight) hours as needed for nausea or vomiting. 12/31/19   Gailen Shelter, PA  Prenatal Vit-Fe Fumarate-FA (PRENATAL COMPLETE) 14-0.4 MG TABS Take 1 tablet by mouth every morning. 11/24/14   Renne Crigler, PA-C  diphenhydrAMINE (BENADRYL) 25 MG tablet Take 1 tablet (25 mg total) by mouth every 6 (six) hours. 02/22/14 11/24/14  Tilden Fossa, MD  famotidine (PEPCID) 20 MG tablet Take 1 tablet (20 mg total) by mouth 2 (two) times daily. 02/22/14 11/24/14  Tilden Fossa, MD    Allergies    Shellfish allergy  Review of Systems   Review of Systems  Constitutional:  Negative for fever.  Gastrointestinal:  Negative for abdominal pain.  Genitourinary:  Negative for dysuria, pelvic pain, vaginal bleeding, vaginal discharge and vaginal pain.  Skin:  Negative for rash and wound.  Allergic/Immunologic: Negative for immunocompromised state.  Neurological:  Negative for weakness.  Hematological:  Negative for adenopathy.  Psychiatric/Behavioral:  Negative for confusion.   All other systems reviewed and are negative.  Physical Exam Updated Vital Signs BP (!) 141/79 (BP Location: Right Arm)    Pulse 88    Temp 99.3 F (  37.4 C) (Oral)    Resp 16    Ht 5\' 7"  (1.702 m)    Wt 63.5 kg    LMP 03/02/2021    SpO2 97%    BMI 21.93 kg/m   Physical Exam Vitals and nursing note reviewed.  Constitutional:      General: She is not in acute distress.    Appearance: She is well-developed. She is not diaphoretic.  HENT:     Head: Normocephalic and atraumatic.  Cardiovascular:     Rate and Rhythm: Normal rate and regular rhythm.     Heart sounds: Normal heart sounds.  Pulmonary:     Effort: Pulmonary effort is normal.     Breath sounds: Normal breath sounds.  Abdominal:     Tenderness: There is no right CVA tenderness or left CVA tenderness.  Skin:    General: Skin is warm and dry.     Findings: No erythema or rash.  Neurological:     Mental Status: She is alert and oriented to person, place, and time.  Psychiatric:        Behavior: Behavior normal.    ED  Results / Procedures / Treatments   Labs (all labs ordered are listed, but only abnormal results are displayed) Labs Reviewed  WET PREP, GENITAL - Abnormal; Notable for the following components:      Result Value   Clue Cells Wet Prep HPF POC PRESENT (*)    WBC, Wet Prep HPF POC >=10 (*)    All other components within normal limits    EKG None  Radiology No results found.  Procedures Procedures   Medications Ordered in ED Medications - No data to display  ED Course  I have reviewed the triage vital signs and the nursing notes.  Pertinent labs & imaging results that were available during my care of the patient were reviewed by me and considered in my medical decision making (see chart for details).  Clinical Course as of 03/08/21 1837  Tue Mar 08, 2021  9447 25 year old female with concern for possible BV due to vaginal odor x2 days.  Plan is to self collect wet prep and treat accordingly.  Doubt PID, denies concerns for retained foreign body. [LM]  1818 Wet prep is positive for BV.  We will treat with Flagyl.  Recommend follow-up with her gynecologist for care as needed. [LM]    Clinical Course User Index [LM] 30   MDM Rules/Calculators/A&P                             Final Clinical Impression(s) / ED Diagnoses Final diagnoses:  Bacterial vaginosis    Rx / DC Orders ED Discharge Orders          Ordered    metroNIDAZOLE (FLAGYL) 500 MG tablet  2 times daily        03/08/21 1836             03/10/21, PA-C 03/08/21 1837    03/10/21, MD 03/08/21 2317

## 2021-03-08 NOTE — ED Triage Notes (Signed)
Pt arrives pov with c/o vaginal odor x 2 days. Denies itching or d/c

## 2021-12-24 ENCOUNTER — Telehealth (HOSPITAL_BASED_OUTPATIENT_CLINIC_OR_DEPARTMENT_OTHER): Payer: Self-pay | Admitting: Emergency Medicine

## 2021-12-24 ENCOUNTER — Emergency Department (HOSPITAL_BASED_OUTPATIENT_CLINIC_OR_DEPARTMENT_OTHER)
Admission: EM | Admit: 2021-12-24 | Discharge: 2021-12-24 | Disposition: A | Discharge status: Home / Self Care | Payer: Medicaid Other | Attending: Emergency Medicine | Admitting: Emergency Medicine

## 2021-12-24 ENCOUNTER — Emergency Department (HOSPITAL_BASED_OUTPATIENT_CLINIC_OR_DEPARTMENT_OTHER): Payer: Medicaid Other

## 2021-12-24 ENCOUNTER — Other Ambulatory Visit: Payer: Self-pay

## 2021-12-24 ENCOUNTER — Encounter (HOSPITAL_BASED_OUTPATIENT_CLINIC_OR_DEPARTMENT_OTHER): Payer: Self-pay | Admitting: Emergency Medicine

## 2021-12-24 DIAGNOSIS — Z3A01 Less than 8 weeks gestation of pregnancy: Secondary | ICD-10-CM

## 2021-12-24 DIAGNOSIS — O99011 Anemia complicating pregnancy, first trimester: Secondary | ICD-10-CM | POA: Insufficient documentation

## 2021-12-24 DIAGNOSIS — B9689 Other specified bacterial agents as the cause of diseases classified elsewhere: Secondary | ICD-10-CM

## 2021-12-24 DIAGNOSIS — O209 Hemorrhage in early pregnancy, unspecified: Secondary | ICD-10-CM | POA: Insufficient documentation

## 2021-12-24 DIAGNOSIS — N9489 Other specified conditions associated with female genital organs and menstrual cycle: Secondary | ICD-10-CM | POA: Insufficient documentation

## 2021-12-24 DIAGNOSIS — N760 Acute vaginitis: Secondary | ICD-10-CM | POA: Diagnosis not present

## 2021-12-24 DIAGNOSIS — O26891 Other specified pregnancy related conditions, first trimester: Secondary | ICD-10-CM | POA: Diagnosis not present

## 2021-12-24 DIAGNOSIS — R42 Dizziness and giddiness: Secondary | ICD-10-CM | POA: Insufficient documentation

## 2021-12-24 DIAGNOSIS — N939 Abnormal uterine and vaginal bleeding, unspecified: Secondary | ICD-10-CM

## 2021-12-24 LAB — URINALYSIS, ROUTINE W REFLEX MICROSCOPIC
Glucose, UA: NEGATIVE mg/dL
Ketones, ur: NEGATIVE mg/dL
Leukocytes,Ua: NEGATIVE
Nitrite: NEGATIVE
Protein, ur: 100 mg/dL — AB
Specific Gravity, Urine: 1.025 (ref 1.005–1.030)
pH: 6 (ref 5.0–8.0)

## 2021-12-24 LAB — WET PREP, GENITAL
Sperm: NONE SEEN
Trich, Wet Prep: NONE SEEN
WBC, Wet Prep HPF POC: <10 (ref <10)
Yeast Wet Prep HPF POC: NONE SEEN

## 2021-12-24 LAB — PROTIME-INR
INR: 1.1 (ref 0.8–1.2)
Prothrombin Time: 14.1 seconds (ref 11.4–15.2)

## 2021-12-24 LAB — CBC WITH DIFFERENTIAL/PLATELET
Abs Immature Granulocytes: 0.02 10*3/uL (ref 0.00–0.07)
Basophils Absolute: 0 10*3/uL (ref 0.0–0.1)
Basophils Relative: 0 %
Eosinophils Absolute: 0.2 10*3/uL (ref 0.0–0.5)
Eosinophils Relative: 3 %
HCT: 31.2 % — ABNORMAL LOW (ref 36.0–46.0)
Hemoglobin: 10.7 g/dL — ABNORMAL LOW (ref 12.0–15.0)
Immature Granulocytes: 0 %
Lymphocytes Relative: 26 %
Lymphs Abs: 1.6 10*3/uL (ref 0.7–4.0)
MCH: 31.3 pg (ref 26.0–34.0)
MCHC: 34.3 g/dL (ref 30.0–36.0)
MCV: 91.2 fL (ref 80.0–100.0)
Monocytes Absolute: 0.5 10*3/uL (ref 0.1–1.0)
Monocytes Relative: 8 %
Neutro Abs: 3.9 10*3/uL (ref 1.7–7.7)
Neutrophils Relative %: 63 %
Platelets: 251 10*3/uL (ref 150–400)
RBC: 3.42 MIL/uL — ABNORMAL LOW (ref 3.87–5.11)
RDW: 13.2 % (ref 11.5–15.5)
WBC: 6.1 10*3/uL (ref 4.0–10.5)
nRBC: 0 % (ref 0.0–0.2)

## 2021-12-24 LAB — BASIC METABOLIC PANEL
Anion gap: 6 (ref 5–15)
BUN: 7 mg/dL (ref 6–20)
CO2: 29 mmol/L (ref 22–32)
Calcium: 8.9 mg/dL (ref 8.9–10.3)
Chloride: 105 mmol/L (ref 98–111)
Creatinine, Ser: 0.68 mg/dL (ref 0.44–1.00)
GFR, Estimated: >60 mL/min (ref >60)
Glucose, Bld: 97 mg/dL (ref 70–99)
Potassium: 3.3 mmol/L — ABNORMAL LOW (ref 3.5–5.1)
Sodium: 140 mmol/L (ref 135–145)

## 2021-12-24 LAB — URINALYSIS, MICROSCOPIC (REFLEX): RBC / HPF: >50 RBC/hpf (ref 0–5)

## 2021-12-24 LAB — HCG, QUANTITATIVE, PREGNANCY: hCG, Beta Chain, Quant, S: 599 m[IU]/mL — ABNORMAL HIGH (ref <5)

## 2021-12-24 MED ORDER — MORPHINE SULFATE (PF) 4 MG/ML IV SOLN: 4.0000 mg | Freq: Once | INTRAVENOUS | Status: DC

## 2021-12-24 MED ORDER — NAPROXEN 500 MG PO TABS: 500.0000 mg | ORAL_TABLET | Freq: Two times a day (BID) | ORAL | 0 refills | Status: DC

## 2021-12-24 MED ORDER — VALACYCLOVIR HCL 1 G PO TABS: 500.0000 mg | ORAL_TABLET | Freq: Two times a day (BID) | ORAL | 0 refills | Status: AC | PRN

## 2021-12-24 MED ORDER — KETOROLAC TROMETHAMINE 15 MG/ML IJ SOLN: 15.0000 mg | Freq: Once | INTRAMUSCULAR | Status: DC

## 2021-12-24 MED ORDER — METRONIDAZOLE 500 MG PO TABS: 500.0000 mg | ORAL_TABLET | Freq: Two times a day (BID) | ORAL | 0 refills | Status: DC

## 2021-12-24 MED ORDER — LACTATED RINGERS IV BOLUS
1000.0000 mL | Freq: Once | INTRAVENOUS | Status: AC
  Administered 2021-12-24: 1000 mL via INTRAVENOUS

## 2021-12-24 MED ORDER — ONDANSETRON HCL 4 MG/2ML IJ SOLN: 4.0000 mg | Freq: Once | INTRAMUSCULAR | Status: DC

## 2021-12-24 NOTE — ED Triage Notes (Addendum)
Pt reports she had an abortion on 9/9. Has had blood clots and dizziness (lightheadedness) afterwards. Had follow up appointment and they said everything seemed okay, but symptoms have continued. Pt ambulatory to triage room with steady gait.

## 2021-12-24 NOTE — Discharge Instructions (Addendum)
[facility] at [facility]  [address], [place], [place] [address] [address]nd Floor  Go on Monday for repeat blood draw for the pregnancy level.  You should be called to schedule this appointment.   You were seen for abnormal vaginal bleeding and cramping after recent medical abortion.  Your ultrasound today suggests that you either have a new pregnancy that is early or potentially retained products from your previous pregnancy.  You are going to go to that appointment for Monday to have further guidance with the OB/GYN team to determine if this is a new pregnancy or retained products and what to do next.  Take Tylenol as needed for cramping or pain.  Drink plenty of fluids.  Take the antibiotics we have prescribed to treat bacterial vaginosis.  This is not an STD.  Come back if any severe worsening bleeding and pain such as bleeding through a pad every hour for 3 or more hours, fainting, severe uncontrolled pain, or any other symptoms concerning to you.

## 2021-12-24 NOTE — ED Notes (Signed)
ED Provider at bedside. 

## 2021-12-24 NOTE — ED Provider Notes (Addendum)
[facility] EMERGENCY DEPARTMENT Provider Note   CSN: [id number] Arrival date & time: 12/24/21  4098     History  Chief Complaint  Patient presents with   Vaginal Bleeding    Gabrielle Lopez is a 26 y.o. female.  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  Patient had medical abortion on 11/19/2021 at [facility] choice in [place].  She had a follow-up on 21 September where she had an ultrasound and pregnancy test which was negative.  She has had ongoing intermittent clots and bleeding and changing pad every couple hours since the medical abortion.  She has associated intermittent lower abdominal cramping and endorses some lightheadedness upon standing or walking around.  No syncopal episodes.  She has never required a blood transfusion.  She has never received RhoGAM.  She denies any abnormal vaginal discharge on top of the bleeding, burning or itching.  She is currently sexually active with 1 partner and is not concerned for STI.  She has had no fevers, nausea, vomiting, urinary symptoms.  No history of polyps, fibroids that she is aware of.  No history of bleeding disorders or clotting disorders in patient or family.  She is not on any birth control.   Vaginal Bleeding      Home Medications Prior to Admission medications   Medication Sig Start Date End Date Taking? Authorizing Provider  metroNIDAZOLE (FLAGYL) 500 MG tablet Take 1 tablet (500 mg total) by mouth 2 (two) times daily. 12/24/21  Yes Mardene Sayer, MD  amoxicillin (AMOXIL) 500 MG capsule Take 1 capsule (500 mg total) by mouth 3 (three) times daily. 01/14/20   Venter, Margaux, PA-C  Doxylamine-Pyridoxine (DICLEGIS PO) Take by mouth.    [provider]  IRON PO Take by mouth.    [provider]  metroNIDAZOLE (FLAGYL) 500 MG tablet Take 1 tablet (500 mg total) by mouth 2 (two) times daily. 03/08/21   Jeannie Fend, PA-C  ondansetron (ZOFRAN ODT) 4 MG disintegrating tablet Take 1 tablet (4 mg total) by mouth every 8 (eight) hours as needed for nausea or vomiting. 12/31/19   Gailen Shelter, PA  Prenatal Vit-Fe Fumarate-FA (PRENATAL COMPLETE) 14-0.4 MG TABS Take 1 tablet by mouth every morning. 11/24/14   Renne Crigler, PA-C  diphenhydrAMINE (BENADRYL) 25 MG tablet Take 1 tablet (25 mg total) by mouth every 6 (six) hours. 02/22/14 11/24/14  Tilden Fossa, MD  famotidine (PEPCID) 20 MG tablet Take 1 tablet (20 mg total) by mouth 2 (two) times daily. 02/22/14 11/24/14  Tilden Fossa, MD      Allergies    Shellfish allergy    Review of Systems   Review of Systems  Genitourinary:  Positive for vaginal bleeding.    Physical Exam Updated Vital Signs BP 135/80   Pulse (!) 104   Temp 98 F (36.7 C) (Oral)   Resp 16   SpO2 100%  Physical Exam Constitutional: Alert and oriented. Well appearing and in no distress. Eyes: Conjunctivae are normal. ENT      Head: Normocephalic and atraumatic.      Nose: No congestion.      Mouth/Throat: Mucous membranes are moist.      Neck: No stridor. Cardiovascular: S1, S2, mildly tachycardic, regular rhythm Respiratory: Normal respiratory effort. Breath sounds are normal. Gastrointestinal: Soft and nontender. There is no CVA tenderness. GU: performed pelvic exam by nurse Keslar at bedside.  Normal  external genitalia, no concerning lesions.  Cervix is thick and closed.  There is some dark blood pooled in the vaginal fornix.  No large clots passing or significant bleeding.  No CMT. Musculoskeletal: Normal range of motion in all extremities. Neurologic: Normal speech and language. No gross focal neurologic deficits are appreciated. Skin: Skin is warm, dry and intact. No rash noted. Psychiatric: Mood and affect are normal. Speech and behavior are normal.  ED Results / Procedures / Treatments   Labs (all labs ordered are listed, but only abnormal results are  displayed) Labs Reviewed  WET PREP, GENITAL - Abnormal; Notable for the following components:      Result Value   Clue Cells Wet Prep HPF POC PRESENT (*)    All other components within normal limits  URINALYSIS, ROUTINE W REFLEX MICROSCOPIC - Abnormal; Notable for the following components:   Color, Urine RED (*)    APPearance CLOUDY (*)    Hgb urine dipstick LARGE (*)    Bilirubin Urine SMALL (*)    Protein, ur 100 (*)    All other components within normal limits  CBC WITH DIFFERENTIAL/PLATELET - Abnormal; Notable for the following components:   RBC 3.42 (*)    Hemoglobin 10.7 (*)    HCT 31.2 (*)    All other components within normal limits  BASIC METABOLIC PANEL - Abnormal; Notable for the following components:   Potassium 3.3 (*)    All other components within normal limits  HCG, QUANTITATIVE, PREGNANCY - Abnormal; Notable for the following components:   hCG, Beta Chain, Quant, S 599 (*)    All other components within normal limits  URINALYSIS, MICROSCOPIC (REFLEX) - Abnormal; Notable for the following components:   Bacteria, UA MANY (*)    All other components within normal limits  PROTIME-INR  GC/CHLAMYDIA PROBE AMP ([place]) NOT AT [facility]    EKG None  Radiology US Pelvis Complete  Result Date: 12/24/2021 CLINICAL DATA:  Abortion 12/19/2021 with concern for retained products of conception. Bleeding with clots EXAM: TRANSABDOMINAL ULTRASOUND OF PELVIS TECHNIQUE: Transabdominal ultrasound examination of the pelvis was performed including evaluation of the uterus, ovaries, adnexal regions, and pelvic cul-de-sac. COMPARISON:  None of this gestation FINDINGS: Uterus Measurements: 9 x 5 x 5 cm = volume: 113 mL. No fibroids or other mass visualized. Endometrium Heterogeneous endometrial cavity which measures up to 11 mm in thickness with sac-like area measuring 4 mm in the mean sac diameter. Peripheral color Doppler flow is seen at the level of the sac. Right ovary Measurements:  18 x 20 x 26 mm = volume: 5 mL. Normal appearance/no adnexal mass. Left ovary Measurements: 24 x 18 x 24 mm = volume: 5 mm mL. Normal appearance/no adnexal mass. Other findings:  No abnormal free fluid. IMPRESSION: Heterogeneous endometrium measuring up to 11 mm thickness with some blood flow. There is an endometrial sac-like structure with mean diameter of 4 mm. Electronically Signed   By: Jorje Guild M.D.   On: 12/24/2021 09:34    Procedures Procedures  Remain on constant cardiac monitoring, initially sinus tachycardia resolved to normal sinus rhythm with normal rates.  Medications Ordered in ED Medications  lactated ringers bolus 1,000 mL (0 mLs Intravenous Stopped 12/24/21 3149)    ED Course/ Medical Decision Making/ A&P                           Medical Decision Making Angelea Lecy is a 26 y.o. female.  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  Patient presented tachycardic to 104 but otherwise hemodynamically stable with normal blood pressure 135/80, no hypotension, no hypoxia and no fever temperature 36.7 C.  Based on patient's symptoms and presentation, most concern would be for possible retained products of conception, structural issues such as fibroids or polyps, symptomatic anemia from ongoing hemorrhage or possible septic abortion although less likely with no fever or systemic illness symptoms.  Chart review shows blood type B+, no RhoGAM required.  Labs were obtained with mild anemia hemoglobin 10.7 but appears to be within patient's baseline and does not require acute transfusion.  She has normal platelet count 251 with normal PTT.  I am not concerned for coagulopathy.  Normal white blood cell count 6.1 with no infectious findings on exam or concerning discharge with no CMT unlikely endometritis or cervicitis.  Beta hCG 599.  Her ultrasound showed evidence of an endometrial sac  with heterogeneous endometrium which I discussed with Dr. Macon Large on-call gynecologist who suggests that likely new pregnancy but could be retained products of conception.  She will arrange for patient's follow-up on Monday for repeat beta hCG and help guide patient's further management.  I advised continued supportive care with patient and strict return precautions and provided her with information for her follow-up on Monday.  All questions answered.  Safe for discharge.  Also discharged with prescription for Flagyl for bacterial vaginosis.  Amount and/or Complexity of Data Reviewed Labs: ordered. Radiology: ordered.  Risk Prescription drug management.    Final Clinical Impression(s) / ED Diagnoses Final diagnoses:  Vaginal bleeding  BV (bacterial vaginosis)  Less than [redacted] weeks gestation of pregnancy    Rx / DC Orders ED Discharge Orders          Ordered    Ambulatory referral to Gynecology        12/24/21 0838    naproxen (NAPROSYN) 500 MG tablet  2 times daily,   Status:  Discontinued        12/24/21 0838    metroNIDAZOLE (FLAGYL) 500 MG tablet  2 times daily        12/24/21 0957              Mardene Sayer, MD 12/24/21 4540    Mardene Sayer, MD 12/24/21 1000

## 2021-12-26 ENCOUNTER — Ambulatory Visit (INDEPENDENT_AMBULATORY_CARE_PROVIDER_SITE_OTHER): Payer: Medicaid Other

## 2021-12-26 ENCOUNTER — Telehealth: Payer: Self-pay

## 2021-12-26 VITALS — BP 111/82 | HR 80 | Wt 152.0 lb

## 2021-12-26 DIAGNOSIS — N939 Abnormal uterine and vaginal bleeding, unspecified: Secondary | ICD-10-CM

## 2021-12-26 DIAGNOSIS — Z9889 Other specified postprocedural states: Secondary | ICD-10-CM

## 2021-12-26 LAB — GC/CHLAMYDIA PROBE AMP (~~LOC~~) NOT AT ARMC
Chlamydia: NEGATIVE
Comment: NEGATIVE
Comment: NORMAL
Neisseria Gonorrhea: NEGATIVE

## 2021-12-26 LAB — BETA HCG QUANT (REF LAB): hCG Quant: 371 m[IU]/mL

## 2021-12-26 NOTE — Progress Notes (Signed)
Pt presents for repeat Beta Hcg. Patient states she is having heavy bleeding and mild pain. Patient was sent to the lab to have a STAT Beta HCG  Gabrielle Lopez l [name], CMA

## 2021-12-26 NOTE — Telephone Encounter (Signed)
Gabrielle Lopez from [facility] called stating patien's STAT Beta HCG is 371. A message will be sent to the provider. Gabrielle Lopez l Gabrielle Lopez, CMA

## 2021-12-28 ENCOUNTER — Encounter: Payer: Self-pay | Admitting: General Practice

## 2021-12-28 ENCOUNTER — Ambulatory Visit: Payer: Medicaid Other

## 2021-12-28 DIAGNOSIS — N939 Abnormal uterine and vaginal bleeding, unspecified: Secondary | ICD-10-CM

## 2021-12-29 LAB — BETA HCG QUANT (REF LAB): hCG Quant: 280 m[IU]/mL

## 2021-12-30 NOTE — Progress Notes (Signed)
Patient seen yesterday at [facility], had US showing retained POC. Received prescription for cytotec. Attempted to call patient and check on her, but no answer and unable to leave message.  Can you call patient on Monday to check on her? She will need a repeat HCG next week to ensure completion.

## 2022-01-02 ENCOUNTER — Telehealth: Payer: Self-pay

## 2022-01-02 LAB — BETA HCG QUANT (REF LAB)

## 2022-01-02 NOTE — Telephone Encounter (Signed)
Called patient and left message.  She needs to come in for HCG lab draw this week.  Seen at [facility] for retained POC and given Cytotec.  Kathrene Alu RN

## 2022-01-05 ENCOUNTER — Ambulatory Visit: Payer: Medicaid Other

## 2022-01-05 VITALS — BP 109/76 | HR 73 | Ht 67.0 in | Wt 147.0 lb

## 2022-01-05 DIAGNOSIS — Z9889 Other specified postprocedural states: Secondary | ICD-10-CM

## 2022-01-12 ENCOUNTER — Ambulatory Visit: Payer: Medicaid Other | Admitting: Family Medicine

## 2023-05-11 ENCOUNTER — Other Ambulatory Visit: Payer: Self-pay

## 2023-05-11 ENCOUNTER — Emergency Department (HOSPITAL_BASED_OUTPATIENT_CLINIC_OR_DEPARTMENT_OTHER)
Admission: EM | Admit: 2023-05-11 | Discharge: 2023-05-11 | Disposition: A | Discharge status: Home / Self Care | Payer: Self-pay | Attending: Emergency Medicine | Admitting: Emergency Medicine

## 2023-05-11 ENCOUNTER — Encounter (HOSPITAL_BASED_OUTPATIENT_CLINIC_OR_DEPARTMENT_OTHER): Payer: Self-pay

## 2023-05-11 DIAGNOSIS — N760 Acute vaginitis: Secondary | ICD-10-CM | POA: Insufficient documentation

## 2023-05-11 DIAGNOSIS — B9689 Other specified bacterial agents as the cause of diseases classified elsewhere: Secondary | ICD-10-CM | POA: Insufficient documentation

## 2023-05-11 LAB — WET PREP, GENITAL
Clue Cells Wet Prep HPF POC: NONE SEEN
Sperm: NONE SEEN
Trich, Wet Prep: NONE SEEN
WBC, Wet Prep HPF POC: >=10 — AB (ref <10)
Yeast Wet Prep HPF POC: NONE SEEN

## 2023-05-11 LAB — PREGNANCY, URINE: Preg Test, Ur: NEGATIVE

## 2023-05-11 MED ORDER — METRONIDAZOLE 500 MG PO TABS: 500.0000 mg | ORAL_TABLET | Freq: Two times a day (BID) | ORAL | 0 refills | Status: AC

## 2023-05-11 NOTE — ED Notes (Signed)
 Reviewed discharge instructions,follow up and medications with pt. Questions answered. States understanding

## 2023-05-11 NOTE — ED Provider Notes (Signed)
 [place] EMERGENCY DEPARTMENT AT [facility] Provider Note   CSN: [id number] Arrival date & time: 05/11/23  8295     History  Chief Complaint  Patient presents with   Vaginal Discharge    Gabrielle Lopez is a 28 y.o. female.   Vaginal Discharge    Patient has a history of bacterial vaginosis.  She presents to the ED with complaints of a vaginal odor.  Patient states she noticed a weird odor in the vaginal area yesterday.  Patient denies any abdominal or pelvic pain.  She has had normal menstrual periods.  She tried to make an appointment with her OB/GYN doctor today as she had the day off today.  There was no availability.  Patient was not sure the next time she would have a day off so she came to the ED for evaluation  Home Medications Prior to Admission medications   Medication Sig Start Date End Date Taking? Authorizing Provider  metroNIDAZOLE (FLAGYL) 500 MG tablet Take 1 tablet (500 mg total) by mouth 2 (two) times daily. 05/11/23  Yes Linwood Dibbles, MD  amoxicillin (AMOXIL) 500 MG capsule Take 1 capsule (500 mg total) by mouth 3 (three) times daily. Patient not taking: Reported on 12/26/2021 01/14/20   Tanda Rockers, PA-C  Doxylamine-Pyridoxine (DICLEGIS PO) Take by mouth. Patient not taking: Reported on 12/26/2021    [provider]  IRON PO Take by mouth. Patient not taking: Reported on 12/26/2021    [provider]  naproxen (NAPROSYN) 500 MG tablet Take 500 mg by mouth 2 (two) times daily with a meal.    [provider]  ondansetron (ZOFRAN ODT) 4 MG disintegrating tablet Take 1 tablet (4 mg total) by mouth every 8 (eight) hours as needed for nausea or vomiting. Patient not taking: Reported on 12/26/2021 12/31/19   Gailen Shelter, PA  Prenatal Vit-Fe Fumarate-FA (PRENATAL COMPLETE) 14-0.4 MG TABS Take 1 tablet by mouth every morning. Patient not taking: Reported on 12/26/2021 11/24/14   Renne Crigler, PA-C  valACYclovir (VALTREX)  1000 MG tablet Take 0.5 tablets (500 mg total) by mouth 2 (two) times daily as needed for up to 30 doses (Take for 3-5 days when outbreak occurs). Patient not taking: Reported on 12/26/2021 12/24/21   Mardene Sayer, MD  diphenhydrAMINE (BENADRYL) 25 MG tablet Take 1 tablet (25 mg total) by mouth every 6 (six) hours. 02/22/14 11/24/14  Tilden Fossa, MD  famotidine (PEPCID) 20 MG tablet Take 1 tablet (20 mg total) by mouth 2 (two) times daily. 02/22/14 11/24/14  Tilden Fossa, MD      Allergies    Shellfish allergy    Review of Systems   Review of Systems  Genitourinary:  Positive for vaginal discharge.    Physical Exam Updated Vital Signs BP 129/87 (BP Location: Right Arm)   Pulse 79   Temp 98.2 F (36.8 C) (Oral)   Resp 18   SpO2 100%  Physical Exam Vitals and nursing note reviewed.  Constitutional:      General: She is not in acute distress.    Appearance: She is well-developed.  HENT:     Head: Normocephalic and atraumatic.     Right Ear: External ear normal.     Left Ear: External ear normal.  Eyes:     General: No scleral icterus.       Right eye: No discharge.        Left eye: No discharge.     Conjunctiva/sclera: Conjunctivae normal.  Neck:     Trachea: No tracheal deviation.  Cardiovascular:     Rate and Rhythm: Normal rate.  Pulmonary:     Effort: Pulmonary effort is normal. No respiratory distress.     Breath sounds: No stridor.  Abdominal:     General: There is no distension.  Genitourinary:    Exam position: Supine.     Labia:        Right: No rash, tenderness or lesion.        Left: No rash, tenderness or lesion.      Vagina: Normal.     Cervix: No cervical motion tenderness or discharge.     Adnexa:        Right: No mass, tenderness or fullness.         Left: No mass, tenderness or fullness.    Musculoskeletal:        General: No swelling or deformity.     Cervical back: Neck supple.  Skin:    General: Skin is warm and dry.      Findings: No rash.  Neurological:     Mental Status: She is alert. Mental status is at baseline.     Cranial Nerves: No dysarthria or facial asymmetry.     Motor: No seizure activity.     ED Results / Procedures / Treatments   Labs (all labs ordered are listed, but only abnormal results are displayed) Labs Reviewed  WET PREP, GENITAL - Abnormal; Notable for the following components:      Result Value   WBC, Wet Prep HPF POC >=10 (*)    All other components within normal limits  PREGNANCY, URINE  GC/CHLAMYDIA PROBE AMP ([place]) NOT AT [facility]    EKG None  Radiology No results found.  Procedures Procedures    Medications Ordered in ED Medications - No data to display  ED Course/ Medical Decision Making/ A&P Clinical Course as of 05/11/23 0835  Fri May 11, 2023  1610 Wet prep does show white blood cells.  Negative pregnancy test [JK]    Clinical Course User Index [JK] Linwood Dibbles, MD                                 Medical Decision Making Amount and/or Complexity of Data Reviewed Labs: ordered.  Risk Prescription drug management.   Patient presented to the ED for evaluation of vaginal odor.  She has no complaints of pain.  No fever.  Low suspicion for cervicitis PID.  Patient's wet prep does show increased white blood cells.  Will treat for bacterial vaginosis.  Evaluation and diagnostic testing in the emergency department does not suggest an emergent condition requiring admission or immediate intervention beyond what has been performed at this time.  The patient is safe for discharge and has been instructed to return immediately for worsening symptoms, change in symptoms or any other concerns.        Final Clinical Impression(s) / ED Diagnoses Final diagnoses:  BV (bacterial vaginosis)    Rx / DC Orders ED Discharge Orders          Ordered    metroNIDAZOLE (FLAGYL) 500 MG tablet  2 times daily        05/11/23 0834              Linwood Dibbles,  MD 05/11/23 [phone]

## 2023-05-11 NOTE — Discharge Instructions (Signed)
 Take the antibiotics as prescribed.  Follow-up with your OB/GYN doctor to be rechecked.

## 2023-05-11 NOTE — ED Triage Notes (Signed)
 Pt reports "weird smell" that developed yesterday in vagina.  Pt reports normal periods. Denies discharge and pelvic pain.

## 2023-05-13 LAB — GC/CHLAMYDIA PROBE AMP (~~LOC~~) NOT AT ARMC
Chlamydia: NEGATIVE
Comment: NEGATIVE
Comment: NORMAL
Neisseria Gonorrhea: NEGATIVE

## 2023-10-09 ENCOUNTER — Encounter (HOSPITAL_BASED_OUTPATIENT_CLINIC_OR_DEPARTMENT_OTHER): Payer: Self-pay

## 2023-10-09 ENCOUNTER — Other Ambulatory Visit: Payer: Self-pay

## 2023-10-09 ENCOUNTER — Emergency Department (HOSPITAL_BASED_OUTPATIENT_CLINIC_OR_DEPARTMENT_OTHER)
Admission: EM | Admit: 2023-10-09 | Discharge: 2023-10-09 | Disposition: A | Discharge status: Home / Self Care | Attending: Emergency Medicine | Admitting: Emergency Medicine

## 2023-10-09 DIAGNOSIS — Z202 Contact with and (suspected) exposure to infections with a predominantly sexual mode of transmission: Secondary | ICD-10-CM | POA: Insufficient documentation

## 2023-10-09 DIAGNOSIS — N939 Abnormal uterine and vaginal bleeding, unspecified: Secondary | ICD-10-CM | POA: Insufficient documentation

## 2023-10-09 LAB — URINALYSIS, ROUTINE W REFLEX MICROSCOPIC
Bilirubin Urine: NEGATIVE
Glucose, UA: NEGATIVE mg/dL
Ketones, ur: NEGATIVE mg/dL
Leukocytes,Ua: NEGATIVE
Nitrite: NEGATIVE
Protein, ur: 100 mg/dL — AB
Specific Gravity, Urine: >=1.03 (ref 1.005–1.030)
pH: 6 (ref 5.0–8.0)

## 2023-10-09 LAB — WET PREP, GENITAL
Clue Cells Wet Prep HPF POC: NONE SEEN
Sperm: NONE SEEN
Trich, Wet Prep: NONE SEEN
WBC, Wet Prep HPF POC: <10 (ref <10)
Yeast Wet Prep HPF POC: NONE SEEN

## 2023-10-09 LAB — CBC
HCT: 34.3 % — ABNORMAL LOW (ref 36.0–46.0)
Hemoglobin: 11.8 g/dL — ABNORMAL LOW (ref 12.0–15.0)
MCH: 31.4 pg (ref 26.0–34.0)
MCHC: 34.4 g/dL (ref 30.0–36.0)
MCV: 91.2 fL (ref 80.0–100.0)
Platelets: 185 K/uL (ref 150–400)
RBC: 3.76 MIL/uL — ABNORMAL LOW (ref 3.87–5.11)
RDW: 12.2 % (ref 11.5–15.5)
WBC: 8.1 K/uL (ref 4.0–10.5)
nRBC: 0 % (ref 0.0–0.2)

## 2023-10-09 LAB — PREGNANCY, URINE: Preg Test, Ur: NEGATIVE

## 2023-10-09 LAB — URINALYSIS, MICROSCOPIC (REFLEX)

## 2023-10-09 MED ORDER — CEFTRIAXONE SODIUM 500 MG IJ SOLR
500.0000 mg | Freq: Once | INTRAMUSCULAR | Status: AC
  Administered 2023-10-09: 500 mg via INTRAMUSCULAR
  Filled 2023-10-09: qty 500

## 2023-10-09 MED ORDER — METRONIDAZOLE 0.75 % VA GEL: 1.0000 | Freq: Two times a day (BID) | VAGINAL | 0 refills | Status: AC

## 2023-10-09 MED ORDER — STERILE WATER FOR INJECTION IJ SOLN
INTRAMUSCULAR | Status: AC
  Administered 2023-10-09: 1 mL
  Filled 2023-10-09: qty 10

## 2023-10-09 MED ORDER — AZITHROMYCIN 250 MG PO TABS
1000.0000 mg | ORAL_TABLET | Freq: Once | ORAL | Status: AC
  Administered 2023-10-09: 1000 mg via ORAL
  Filled 2023-10-09: qty 4

## 2023-10-09 NOTE — ED Triage Notes (Signed)
 Pt reports having period starting the 14th and lasted for five days. Pt reports she stopped bleeding for two and now has had light vaginal bleeding x2 days. Pt denies any pain, reports having abortion the middle of June.

## 2023-10-09 NOTE — ED Provider Notes (Signed)
 Gabrielle Lopez Provider Note   CSN: [id number] Arrival date & time: 10/09/23  2038     Patient presents with: Vaginal Bleeding   Gabrielle Lopez is a 28 y.o. female.   Patient to ED for evaluation of vaginal bleeding. She reports having an abortion 2 months ago at 7 weeks and her first menses was on the 14th. This stopped and started again 2 days later. No abdominal pain. She reports a mild vaginal discharge like when she has recurrent BV. She has resumed sexual activity and reports no barrier protection and would like to be checked for STD's. No fever, nausea. She denies dysuria or urinary frequency.   The history is provided by the patient. No language interpreter was used.  Vaginal Bleeding      Prior to Admission medications   Medication Sig Start Date End Date Taking? Authorizing Provider  metroNIDAZOLE  (METROGEL ) 0.75 % vaginal gel Place 1 Applicatorful vaginally 2 (two) times daily. 10/09/23  Yes Aliveah Gallant, Margit, PA-C  amoxicillin  (AMOXIL ) 500 MG capsule Take 1 capsule (500 mg total) by mouth 3 (three) times daily. Patient not taking: Reported on 12/26/2021 01/14/20   Venter, Margaux, PA-C  Doxylamine-Pyridoxine (DICLEGIS PO) Take by mouth. Patient not taking: Reported on 12/26/2021    [provider]  IRON PO Take by mouth. Patient not taking: Reported on 12/26/2021    [provider]  metroNIDAZOLE  (FLAGYL ) 500 MG tablet Take 1 tablet (500 mg total) by mouth 2 (two) times daily. 05/11/23   Randol Simmonds, MD  naproxen  (NAPROSYN ) 500 MG tablet Take 500 mg by mouth 2 (two) times daily with a meal.    [provider]  ondansetron  (ZOFRAN  ODT) 4 MG disintegrating tablet Take 1 tablet (4 mg total) by mouth every 8 (eight) hours as needed for nausea or vomiting. Patient not taking: Reported on 12/26/2021 12/31/19   Neldon Hamp RAMAN, PA  Prenatal Vit-Fe Fumarate-FA (PRENATAL COMPLETE) 14-0.4 MG TABS Take 1 tablet by  mouth every morning. Patient not taking: Reported on 12/26/2021 11/24/14   Geiple, Joshua, PA-C  valACYclovir  (VALTREX ) 1000 MG tablet Take 0.5 tablets (500 mg total) by mouth 2 (two) times daily as needed for up to 30 doses (Take for 3-5 days when outbreak occurs). Patient not taking: Reported on 12/26/2021 12/24/21   Ethyl Richerd BROCKS, MD  diphenhydrAMINE  (BENADRYL ) 25 MG tablet Take 1 tablet (25 mg total) by mouth every 6 (six) hours. 02/22/14 11/24/14  Griselda Norris, MD  famotidine  (PEPCID ) 20 MG tablet Take 1 tablet (20 mg total) by mouth 2 (two) times daily. 02/22/14 11/24/14  Griselda Norris, MD    Allergies: Ibuprofen and Shellfish allergy    Review of Systems  Genitourinary:  Positive for vaginal bleeding.    Updated Vital Signs BP (!) 140/94 (BP Location: Right Arm)   Pulse 94   Temp 98.7 F (37.1 C) (Oral)   Resp 16   Ht 5' 6 (1.676 m)   Wt 74.8 kg   LMP 09/23/2023 (Exact Date)   SpO2 97%   BMI 26.63 kg/m   Physical Exam Vitals and nursing note reviewed.  Constitutional:      General: She is not in acute distress.    Appearance: She is well-developed. She is not ill-appearing.  Pulmonary:     Effort: Pulmonary effort is normal.  Abdominal:     General: There is no distension.     Palpations: Abdomen is soft.     Tenderness: There is  no abdominal tenderness.  Musculoskeletal:        General: Normal range of motion.     Cervical back: Normal range of motion.  Skin:    General: Skin is warm and dry.  Neurological:     Mental Status: She is alert and oriented to person, place, and time.     (all labs ordered are listed, but only abnormal results are displayed) Labs Reviewed  URINALYSIS, ROUTINE W REFLEX MICROSCOPIC - Abnormal; Notable for the following components:      Result Value   APPearance HAZY (*)    Hgb urine dipstick LARGE (*)    Protein, ur 100 (*)    All other components within normal limits  CBC - Abnormal; Notable for the following  components:   RBC 3.76 (*)    Hemoglobin 11.8 (*)    HCT 34.3 (*)    All other components within normal limits  URINALYSIS, MICROSCOPIC (REFLEX) - Abnormal; Notable for the following components:   Bacteria, UA RARE (*)    All other components within normal limits  WET PREP, GENITAL  PREGNANCY, URINE  GC/CHLAMYDIA PROBE AMP ([place]) NOT AT [facility]    EKG: None  Radiology: No results found.   Procedures   Medications Ordered in the ED  azithromycin  (ZITHROMAX ) tablet 1,000 mg (1,000 mg Oral Given 10/09/23 2154)  cefTRIAXone  (ROCEPHIN ) injection 500 mg (500 mg Intramuscular Given 10/09/23 2155)  sterile water  (preservative free) injection (1 mL  Given 10/09/23 2156)    Clinical Course as of 10/09/23 2202  Tue Oct 09, 2023  2116 Patient with irregular vaginal bleeding, no pelvic or abdominal pain, no fever.  [SU]  2200 Labs unremarkable, no leukocytosis, UTI, pregnancy or positivity on wet prep. She is concerned that the vaginal odor is similar to previous BV and requests the treatment for this. Rx provided. She accepts treatment for GC/chlamydia in the ED. Cultures pending. Discussed referral to GYN given her current symptoms. Patient is comfortable with outpatient follow up.  [SU]    Clinical Course User Index [SU] Odell Balls, PA-C                                 Medical Decision Making Amount and/or Complexity of Data Reviewed Labs: ordered.  Risk Prescription drug management.        Final diagnoses:  Abnormal uterine bleeding  STD exposure    ED Discharge Orders          Ordered    metroNIDAZOLE  (METROGEL ) 0.75 % vaginal gel  2 times daily        10/09/23 2143               Odell Balls, PA-C 10/09/23 2202    Yolande Lamar BROCKS, MD 10/10/23 2356

## 2023-10-09 NOTE — Discharge Instructions (Signed)
 Use the Metrogel  as prescribed. As we discussed, the wet prep tonight was negative so this may or may not help with the odor you describe.   Follow up with a gynecologist if irregular bleeding continues.

## 2023-10-11 LAB — GC/CHLAMYDIA PROBE AMP (~~LOC~~) NOT AT ARMC
Chlamydia: NEGATIVE
Comment: NEGATIVE
Comment: NORMAL
Neisseria Gonorrhea: NEGATIVE
